# Patient Record
Sex: Male | Born: 1950 | Race: White | Hispanic: No | Smoking: Former smoker
Health system: Southern US, Community
[De-identification: ages and names within clinical notes are randomized; demographics above are authoritative.]

## PROBLEM LIST (undated history)

## (undated) DIAGNOSIS — N529 Male erectile dysfunction, unspecified: Secondary | ICD-10-CM

## (undated) DIAGNOSIS — K429 Umbilical hernia without obstruction or gangrene: Secondary | ICD-10-CM

## (undated) DIAGNOSIS — Z86010 Personal history of colonic polyps: Secondary | ICD-10-CM

## (undated) HISTORY — DX: Personal history of colonic polyps: Z86.010

## (undated) HISTORY — PX: HERNIA REPAIR: SHX51

## (undated) HISTORY — DX: Umbilical hernia without obstruction or gangrene: K42.9

## (undated) HISTORY — DX: Male erectile dysfunction, unspecified: N52.9

---

## 2000-04-21 ENCOUNTER — Other Ambulatory Visit: Admission: RE | Admit: 2000-04-21 | Discharge: 2000-04-21 | Payer: Self-pay | Admitting: Internal Medicine

## 2003-02-08 ENCOUNTER — Encounter: Admission: RE | Admit: 2003-02-08 | Discharge: 2003-02-08 | Payer: Self-pay | Admitting: General Surgery

## 2003-02-10 ENCOUNTER — Ambulatory Visit (HOSPITAL_BASED_OUTPATIENT_CLINIC_OR_DEPARTMENT_OTHER): Admission: RE | Admit: 2003-02-10 | Discharge: 2003-02-10 | Payer: Self-pay | Admitting: General Surgery

## 2003-02-10 ENCOUNTER — Ambulatory Visit (HOSPITAL_COMMUNITY): Admission: RE | Admit: 2003-02-10 | Discharge: 2003-02-10 | Payer: Self-pay | Admitting: General Surgery

## 2003-02-10 ENCOUNTER — Encounter (INDEPENDENT_AMBULATORY_CARE_PROVIDER_SITE_OTHER): Payer: Self-pay | Admitting: Specialist

## 2009-08-08 ENCOUNTER — Ambulatory Visit: Payer: Self-pay | Admitting: Internal Medicine

## 2009-08-08 DIAGNOSIS — J019 Acute sinusitis, unspecified: Secondary | ICD-10-CM

## 2009-08-08 DIAGNOSIS — Z860100 Personal history of colon polyps, unspecified: Secondary | ICD-10-CM | POA: Insufficient documentation

## 2009-08-08 HISTORY — DX: Personal history of colon polyps, unspecified: Z86.0100

## 2009-08-08 HISTORY — DX: Personal history of colonic polyps: Z86.010

## 2010-02-06 ENCOUNTER — Ambulatory Visit: Admit: 2010-02-06 | Payer: Self-pay | Admitting: Internal Medicine

## 2010-03-08 NOTE — Assessment & Plan Note (Signed)
Summary: NEW/ [facility]/ [facility]   Vital Signs:  Patient profile:   60 year old male Height:      73 inches Weight:      197.50 pounds BMI:     26.15 O2 Sat:      96 % on Room air Temp:     98.1 degrees F oral Pulse rate:   82 / minute BP sitting:   140 / 78  (left arm) Cuff size:   regular  Vitals Entered By: Zella Ball Ewing CMA Duncan Dull) (August 08, 2009 1:07 PM)  O2 Flow:  Room air  Preventive Care Screening  Colonoscopy:    Date:  04/18/2000    Next Due:  04/2010    Results:  Hyperplastic Polyp      declines tetanus today  CC: New Patient, sinus infection/RE   CC:  New Patient and sinus infection/RE.  History of Present Illness: here to re-establish after being gone for > 3 yrs;  here with 1 wk onset facial pain, pressure, fever and greenish d/c; as well as bilat ear pressure, dizziness and nause;  no ST, and Pt denies CP, sob, doe, wheezing, orthopnea, pnd, worsening LE edema, palps, dizziness or syncope   Preventive Screening-Counseling & Management  Alcohol-Tobacco     Smoking Status: quit      Drug Use:  no.    Problems Prior to Update: 1)  Colonic Polyps, Hx of  (ICD-V12.72) 2)  Sinusitis- Acute-nos  (ICD-461.9)  Medications Prior to Update: 1)  None  Current Medications (verified): 1)  Clarithromycin 500 Mg Tabs (Clarithromycin) .Marland Kitchen.. 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 mother with HTN father with lung cancer  Social History: Reviewed history and no changes  required. Married 2 children work  - Restaurant manager, fast food - Glass blower/designer Former Smoker Alcohol use-yes Drug use-no Smoking Status:  quit Drug Use:  no  Review of Systems       all otherwise negative per pt -    Physical Exam  General:  alert and overweight-appearing.  , mild ill  Head:  normocephalic and atraumatic.   Eyes:  vision grossly intact, pupils equal, and pupils round.   Ears:  bilat tm's mild red, sinus tender bilat Nose:  nasal dischargemucosal pallor and mucosal edema.   Mouth:  pharyngeal erythema and fair dentition.   Neck:  supple and no masses.   Lungs:  normal respiratory effort and normal breath sounds.   Heart:  normal rate and regular rhythm.   Extremities:  no edema, no erythema    Impression & Recommendations:  Problem # 1:  SINUSITIS- ACUTE-NOS (ICD-461.9)  His updated medication list for this problem includes:    Clarithromycin 500 Mg Tabs (Clarithromycin) .Marland Kitchen... 1po two times a day treat as above, f/u any worsening signs or symptoms   Complete Medication List: 1)  Clarithromycin 500 Mg Tabs (Clarithromycin) .Marland Kitchen.. 1po two times a day  Patient Instructions: 1)  Please take all new medications as prescribed 2)  Continue all previous medications as before this visit  3)  You can also use Mucinex OTC or it's generic for congestion  4)  Please schedule a follow-up appointment in 6 months with CPX labs Prescriptions: CLARITHROMYCIN 500 MG TABS (CLARITHROMYCIN) 1po two times a day  #20 x 0   Entered and Authorized by:   Corwin Levins MD   Signed by:   Corwin Levins MD on 08/08/2009   Method used:   Print then Give to Patient   RxID:   [id number]

## 2010-06-22 NOTE — Op Note (Signed)
NAME:  Randy Sutton, Randy Sutton                           ACCOUNT NO.:  [id number]   MEDICAL RECORD NO.:  [id number]                   PATIENT TYPE:  OUT   LOCATION:  [facility]                                 FACILITY:  [facility]   PHYSICIAN:  Ollen Gross. Vernell Morgans, M.D.              DATE OF BIRTH:  [birth year]   DATE OF PROCEDURE:  02/10/2003  DATE OF DISCHARGE:  02/10/2003                                 OPERATIVE REPORT   PREOPERATIVE DIAGNOSIS:  Right inguinal hernia.   POSTOPERATIVE DIAGNOSIS:  Right indirect inguinal hernia.   PROCEDURE:  Right inguinal hernia repair with mesh.   SURGEON:  Ollen Gross. Carolynne Edouard, M.D.   ANESTHESIA:  General endotracheal anesthesia.   PROCEDURE:  After informed consent was obtained, the patient was brought to  the operating room and placed in the supine position on the operating table.  After induction of general anesthesia, the patient's abdomen and right groin  were prepped with Betadine and draped in the usual sterile manner.  An  incision was made from the edge of the pubic tubercle on the right towards  the anterior superior iliac spine for a distance of about 5-6 cm.  This  incision was carried down through the skin and subcutaneous tissue sharply  with electrocautery.  A small bridging vein was encountered in the  subcutaneous tissue which was clamped with hemostats, divided, and ligated  with 3-0 silk ties.  The rest of the incision was carried down through the  subcutaneous tissues sharply with the electrocautery until the external  oblique fascia was encountered.  The external oblique fascia was opened  along its fibers with a 15 blade knife and then Metzenbaum scissors to the  apex of the external ring.  Beneath this, the fascia and the abdominal  sidewall of the wound were retracted laterally with a Weitlaner retractor.  Blunt dissection was carried out at the edge of the pubic tubercle until two  fingers could surround the cord structures.  A 1/2 inch Penrose  drain was  then placed around the cord structures for retraction purposes.  Gentle  skeletonization of the cord was performed by a combination of blunt  dissection with a hemostat and sharp dissection with the electrocautery.  Care was taken to avoid damage to the ilioinguinal nerve, vas deferens, and  artery to the testicle.  A hernia sac was encountered running with the cord  structures.  The hernia sac was gently teased away from the rest of the cord  structures until it was free down to its base.  The sac was then opened.  There were no visceral contents within the sac.  The sac was then ligated at  its base with a 2-0 silk suture ligature and the sac was then excised.  The  stump of the sac that had been ligated was then allowed to retract back  beneath the transversalis fascia towards the abdomen.  Next, a piece of  atrium mesh was then cut to fit, tails were cut on the mesh laterally to  wrap around the cord structures.  The mesh was then sewn inferiorly to the  shelving edge of the inguinal ligament using a running 2-0 Prolene stitch.  The mesh was then anchored superiorly with interrupted vertical mattress 2-0  Prolene stitches to the muscular aponeurotic transversalis layer.  The tails  were wrapped around the cord structures and anchored laterally with an  interrupted 2-0 Prolene stitch to the shelving edge of the inguinal  ligament.  Once this was accomplished, the mesh was in good position without  any tension.  The repair was nicely in place.  The wound was then copiously  irrigated with saline.  The external oblique fascia was then closed in a  running fashion with 2-0 Vicryl stitch.  The wound was copiously irrigated  again with saline.  The subcutaneous fascia was closed with running 3-0  Vicryl.  The wound was then infiltrated with 0.25% Marcaine with epinephrine  and the skin was closed with a running 4-0 Monocryl subcuticular  stitch.  Benzoin, Steri-Strips, and sterile  dressings were applied.  The  patient tolerated the procedure well.  At the end of the case, all needle,  sponge, and instrument counts were correct.  The patient was awakened and  taken to the recovery room in stable condition.                                               Ollen Gross. Vernell Morgans, M.D.    PST/MEDQ  D:  02/25/2003  T:  02/25/2003  Job:  [id number]

## 2011-04-09 ENCOUNTER — Encounter: Payer: Self-pay | Admitting: Internal Medicine

## 2011-04-09 DIAGNOSIS — Z0000 Encounter for general adult medical examination without abnormal findings: Secondary | ICD-10-CM | POA: Insufficient documentation

## 2011-04-10 ENCOUNTER — Ambulatory Visit (INDEPENDENT_AMBULATORY_CARE_PROVIDER_SITE_OTHER): Payer: PRIVATE HEALTH INSURANCE | Admitting: Internal Medicine

## 2011-04-10 VITALS — BP 130/72 | HR 74 | Temp 97.0°F | Ht 73.0 in | Wt 193.1 lb

## 2011-04-10 DIAGNOSIS — J019 Acute sinusitis, unspecified: Secondary | ICD-10-CM | POA: Insufficient documentation

## 2011-04-10 DIAGNOSIS — Z0000 Encounter for general adult medical examination without abnormal findings: Secondary | ICD-10-CM

## 2011-04-10 MED ORDER — LEVOFLOXACIN 250 MG PO TABS: 250.0000 mg | ORAL_TABLET | Freq: Every day | ORAL | Status: AC

## 2011-04-10 NOTE — Patient Instructions (Signed)
Take all new medications as prescribed Continue all other medications as before Please call as we discussed to have your colonscopy scheduled Please return in 6 mo with Lab testing done 3-5 days before

## 2011-04-14 ENCOUNTER — Encounter: Payer: Self-pay | Admitting: Internal Medicine

## 2011-04-14 NOTE — Assessment & Plan Note (Signed)
Mild to mod, for antibx course,  to f/u any worsening symptoms or concerns 

## 2011-04-14 NOTE — Progress Notes (Signed)
  Subjective:    Patient ID: Randy Sutton, male    DOB: 24-Aug-1950, 61 y.o.   MRN: [id number]  HPI   Here with 3 days acute onset fever, facial pain, pressure, general weakness and malaise, and greenish d/c, with slight ST, but little to no cough and Pt denies chest pain, increased sob or doe, wheezing, orthopnea, PND, increased LE swelling, palpitations, dizziness or syncope.  Has also known RIH stable without incresed swelling or pain.  Also with chronic tinnitus.  Pt denies new neurological symptoms such as new headache, or facial or extremity weakness or numbness   Pt denies polydipsia, polyuria.   Pt denies fever, wt loss, night sweats, loss of appetite, or other constitutional symptoms except for the above.  Has received letter for colonoscopy f/u, just has not called yet to schedule Past Medical History  Diagnosis Date  . COLONIC POLYPS, HX OF 08/08/2009   History reviewed. No pertinent past surgical history.  reports that he has quit smoking. He does not have any smokeless tobacco history on file. He reports that he drinks alcohol. He reports that he does not use illicit drugs. family history includes Cancer in his father and Hypertension in his mother. No Known Allergies No current outpatient prescriptions on file prior to visit.   Review of Systems Review of Systems  Constitutional: Negative for diaphoresis and unexpected weight change.  HENT: Negative for drooling and tinnitus.   Eyes: Negative for photophobia and visual disturbance.  Respiratory: Negative for choking and stridor.   Gastrointestinal: Negative for vomiting and blood in stool.  Genitourinary: Negative for hematuria and decreased urine volume.    Objective:   Physical Exam BP 130/72  Pulse 74  Temp(Src) 97 F (36.1 C) (Oral)  Ht 6\' 1"  (1.854 m)  Wt 193 lb 2 oz (87.601 kg)  BMI 25.48 kg/m2  SpO2 97% Physical Exam  VS noted Constitutional: Pt appears well-developed and well-nourished.  HENT: Head:  Normocephalic.  Right Ear: External ear normal.  Left Ear: External ear normal.  Bilat tm's mild erythema.  Sinus tender bilat.  Pharynx mild erythema Eyes: Conjunctivae and EOM are normal. Pupils are equal, round, and reactive to light.  Neck: Normal range of motion. Neck supple.  Cardiovascular: Normal rate and regular rhythm.   Pulmonary/Chest: Effort normal and breath sounds normal.  Neurological: Pt is alert. No cranial nerve deficit.  Skin: Skin is warm. No erythema.  Psychiatric: Pt behavior is normal. Thought content normal.     Assessment & Plan:

## 2011-10-09 ENCOUNTER — Ambulatory Visit (INDEPENDENT_AMBULATORY_CARE_PROVIDER_SITE_OTHER): Payer: PRIVATE HEALTH INSURANCE | Admitting: Internal Medicine

## 2011-10-09 ENCOUNTER — Encounter: Payer: Self-pay | Admitting: Internal Medicine

## 2011-10-09 ENCOUNTER — Other Ambulatory Visit (INDEPENDENT_AMBULATORY_CARE_PROVIDER_SITE_OTHER): Payer: PRIVATE HEALTH INSURANCE

## 2011-10-09 VITALS — BP 122/82 | HR 64 | Temp 97.4°F | Ht 73.0 in | Wt 195.1 lb

## 2011-10-09 DIAGNOSIS — Z0000 Encounter for general adult medical examination without abnormal findings: Secondary | ICD-10-CM

## 2011-10-09 DIAGNOSIS — Z23 Encounter for immunization: Secondary | ICD-10-CM

## 2011-10-09 DIAGNOSIS — K429 Umbilical hernia without obstruction or gangrene: Secondary | ICD-10-CM | POA: Insufficient documentation

## 2011-10-09 DIAGNOSIS — Z86010 Personal history of colonic polyps: Secondary | ICD-10-CM

## 2011-10-09 DIAGNOSIS — J309 Allergic rhinitis, unspecified: Secondary | ICD-10-CM | POA: Insufficient documentation

## 2011-10-09 DIAGNOSIS — N529 Male erectile dysfunction, unspecified: Secondary | ICD-10-CM | POA: Insufficient documentation

## 2011-10-09 LAB — HEPATIC FUNCTION PANEL
ALT: 21 U/L (ref 0–53)
Alkaline Phosphatase: 58 U/L (ref 39–117)
Bilirubin, Direct: 0.2 mg/dL (ref 0.0–0.3)
Total Bilirubin: 0.9 mg/dL (ref 0.3–1.2)
Total Protein: 6.4 g/dL (ref 6.0–8.3)

## 2011-10-09 LAB — BASIC METABOLIC PANEL
CO2: 27 mEq/L (ref 19–32)
Chloride: 104 mEq/L (ref 96–112)
Potassium: 4.4 mEq/L (ref 3.5–5.1)
Sodium: 138 mEq/L (ref 135–145)

## 2011-10-09 LAB — LIPID PANEL
LDL Cholesterol: 110 mg/dL — ABNORMAL HIGH (ref 0–99)
Total CHOL/HDL Ratio: 4

## 2011-10-09 LAB — PSA: PSA: 0.24 ng/mL (ref 0.10–4.00)

## 2011-10-09 LAB — URINALYSIS, ROUTINE W REFLEX MICROSCOPIC
Bilirubin Urine: NEGATIVE
Hgb urine dipstick: NEGATIVE
Total Protein, Urine: NEGATIVE
Urine Glucose: NEGATIVE
Urobilinogen, UA: 0.2 (ref 0.0–1.0)

## 2011-10-09 LAB — CBC WITH DIFFERENTIAL/PLATELET
Basophils Absolute: 0.1 10*3/uL (ref 0.0–0.1)
Basophils Relative: 0.8 % (ref 0.0–3.0)
Eosinophils Absolute: 0.3 10*3/uL (ref 0.0–0.7)
Hemoglobin: 14.9 g/dL (ref 13.0–17.0)
Lymphocytes Relative: 31.6 % (ref 12.0–46.0)
MCHC: 34.1 g/dL (ref 30.0–36.0)
MCV: 89.2 fl (ref 78.0–100.0)
Monocytes Absolute: 0.6 10*3/uL (ref 0.1–1.0)
Neutro Abs: 3.4 10*3/uL (ref 1.4–7.7)
Neutrophils Relative %: 54.5 % (ref 43.0–77.0)
RBC: 4.9 Mil/uL (ref 4.22–5.81)
RDW: 12.9 % (ref 11.5–14.6)

## 2011-10-09 MED ORDER — ASPIRIN 81 MG PO TBEC: 81.0000 mg | DELAYED_RELEASE_TABLET | Freq: Every day | ORAL | Status: AC

## 2011-10-09 MED ORDER — FEXOFENADINE HCL 180 MG PO TABS: 180.0000 mg | ORAL_TABLET | Freq: Every day | ORAL | Status: DC

## 2011-10-09 MED ORDER — SILDENAFIL CITRATE 100 MG PO TABS: 50.0000 mg | ORAL_TABLET | Freq: Every day | ORAL | Status: DC | PRN

## 2011-10-09 NOTE — Patient Instructions (Addendum)
Take all new medications as prescribed - the allegra Continue all other medications as before- the viagra Please have the pharmacy call with any refills you may need. Please start the Aspirin 81 mg - 1 per day - Enteric Coated only Please call for your colonoscopy follow up as per the letter you received. You had the tetanus shot today Please go to LAB in the Basement for the blood and/or urine tests to be done today You will be contacted by phone if any changes need to be made immediately.  Otherwise, you will receive a letter about your results with an explanation. Please return in 1 year for your yearly visit, or sooner if needed, with Lab testing done 3-5 days before

## 2011-10-09 NOTE — Assessment & Plan Note (Signed)
For allegra prn 

## 2011-10-09 NOTE — Assessment & Plan Note (Signed)
Overall doing well, age appropriate education and counseling updated, referrals for preventative services and immunizations addressed, dietary and smoking counseling addressed, most recent labs and ECG reviewed.  I have personally reviewed and have noted: 1) the patient's medical and social history 2) The pt's use of alcohol, tobacco, and illicit drugs 3) The patient's current medications and supplements 4) Functional ability including ADL's, fall risk, home safety risk, hearing and visual impairment 5) Diet and physical activities 6) Evidence for depression or mood disorder 7) The patient's height, weight, and BMI have been recorded in the chart I have made referrals, and provided counseling and education based on review of the above For tetanus, and labs

## 2011-10-09 NOTE — Assessment & Plan Note (Signed)
Pt to call for colonscopy

## 2011-10-09 NOTE — Progress Notes (Signed)
Subjective:    Patient ID: Randy Sutton, male    DOB: Jun 14, 1950, 61 y.o.   MRN: [id number]  HPI  Here for wellness and f/u;  Overall doing ok;  Pt denies CP, worsening SOB, DOE, wheezing, orthopnea, PND, worsening LE edema, palpitations, dizziness or syncope.  Pt denies neurological change such as new Headache, facial or extremity weakness.  Pt denies polydipsia, polyuria, or low sugar symptoms. Pt states overall good compliance with treatment and medications, good tolerability, and trying to follow lower cholesterol diet.  Pt denies worsening depressive symptoms, suicidal ideation or panic. No fever, wt loss, night sweats, loss of appetite, or other constitutional symptoms.  Pt states good ability with ADL's, low fall risk, home safety reviewed and adequate, no significant changes in hearing or vision, and occasionally active with exercise.  Does have several wks ongoing nasal allergy symptoms with clear congestion, itch and sneeze, without fever, pain, ST, cough or wheezing. But tonsils have been swollen as well however , seems to gag him off and on;  claritin may have helped at least the sinus in the past, has taken several his wife has had.   Pt denies fever, wt loss, night sweats, loss of appetite.  Trying to stay away from chronic med. Only takes Asa prn. Past Medical History  Diagnosis Date  . COLONIC POLYPS, HX OF 08/08/2009   No past surgical history on file.  reports that he has quit smoking. He does not have any smokeless tobacco history on file. He reports that he drinks alcohol. He reports that he does not use illicit drugs. family history includes Cancer in his father and Hypertension in his mother. No Known Allergies No current outpatient prescriptions on file prior to visit.   Review of Systems Review of Systems  Constitutional: Negative for diaphoresis, activity change, appetite change and unexpected weight change.  HENT: Negative for hearing loss, ear pain, facial swelling, mouth  sores and neck stiffness.   Eyes: Negative for pain, redness and visual disturbance.  Respiratory: Negative for shortness of breath and wheezing.   Cardiovascular: Negative for chest pain and palpitations.  Gastrointestinal: Negative for diarrhea, blood in stool, abdominal distention and rectal pain.  Genitourinary: Negative for hematuria, flank pain and decreased urine volume.  Musculoskeletal: Negative for myalgias and joint swelling.  Skin: Negative for color change and wound.  Neurological: Negative for syncope and numbness.  Hematological: Negative for adenopathy.  Psychiatric/Behavioral: Negative for hallucinations, self-injury, decreased concentration and agitation.     Objective:   Physical Exam BP 122/82  Pulse 64  Temp 97.4 F (36.3 C) (Oral)  Ht 6\' 1"  (1.854 m)  Wt 195 lb 2 oz (88.508 kg)  BMI 25.74 kg/m2  SpO2 96% Physical Exam  VS noted Constitutional: Pt is oriented to person, place, and time. Appears well-developed and well-nourished.  HENT:  Head: Normocephalic and atraumatic.  Right Ear: External ear normal.  Left Ear: External ear normal.  Nose: Nose normal.  Mouth/Throat: Oropharynx is clear and moist. pharynx with mild cobblestoning only,  No tonsilar enlargement Eyes: Conjunctivae and EOM are normal. Pupils are equal, round, and reactive to light.  Neck: Normal range of motion. Neck supple. No JVD present. No tracheal deviation present.  Cardiovascular: Normal rate, regular rhythm, normal heart sounds and intact distal pulses.   Pulmonary/Chest: Effort normal and breath sounds normal.  Abdominal: Soft. Bowel sounds are normal. There is no tenderness.  Musculoskeletal: Normal range of motion. Exhibits no edema.  Lymphadenopathy:  Has no  cervical adenopathy.  Neurological: Pt is alert and oriented to person, place, and time. Pt has normal reflexes. No cranial nerve deficit.  Skin: Skin is warm and dry. No rash noted.  Psychiatric:  Has  normal mood and  affect. Behavior is normal.     Assessment & Plan:

## 2011-10-09 NOTE — Assessment & Plan Note (Signed)
For viagra prn 

## 2011-10-31 ENCOUNTER — Encounter: Payer: Self-pay | Admitting: Internal Medicine

## 2012-10-12 ENCOUNTER — Encounter: Payer: PRIVATE HEALTH INSURANCE | Admitting: Internal Medicine

## 2012-10-29 ENCOUNTER — Encounter: Payer: Self-pay | Admitting: Internal Medicine

## 2012-10-29 ENCOUNTER — Ambulatory Visit (INDEPENDENT_AMBULATORY_CARE_PROVIDER_SITE_OTHER): Payer: PRIVATE HEALTH INSURANCE | Admitting: Internal Medicine

## 2012-10-29 VITALS — BP 140/90 | HR 91 | Temp 97.9°F | Ht 73.0 in | Wt 198.5 lb

## 2012-10-29 DIAGNOSIS — J309 Allergic rhinitis, unspecified: Secondary | ICD-10-CM

## 2012-10-29 DIAGNOSIS — N529 Male erectile dysfunction, unspecified: Secondary | ICD-10-CM

## 2012-10-29 DIAGNOSIS — J019 Acute sinusitis, unspecified: Secondary | ICD-10-CM

## 2012-10-29 MED ORDER — MECLIZINE HCL 12.5 MG PO TABS: 12.5000 mg | ORAL_TABLET | Freq: Three times a day (TID) | ORAL | Status: DC | PRN

## 2012-10-29 MED ORDER — LEVOFLOXACIN 250 MG PO TABS: 250.0000 mg | ORAL_TABLET | Freq: Every day | ORAL | Status: DC

## 2012-10-29 NOTE — Patient Instructions (Signed)
Please take all new medication as prescribed  Please continue all other medications as before, and refills have been done if requested.  Please have the pharmacy call with any other refills you may need.  You can also take Delsym OTC for cough, and/or Mucinex (or it's generic off brand) for congestion, and tylenol as needed for pain.   

## 2012-10-29 NOTE — Progress Notes (Signed)
  Subjective:    Patient ID: Randy Sutton, male    DOB: 1950-02-20, 62 y.o.   MRN: [id number]  HPI  Here with 2-3 days acute onset fever, facial pain, pressure, headache, general weakness and malaise, and greenish d/c, with mild ST and cough, but pt denies chest pain, wheezing, increased sob or doe, orthopnea, PND, increased LE swelling, palpitations, dizziness or syncope.  Does have several wks ongoing nasal allergy symptoms with clearish congestion, itch and sneezing, without fever, pain, ST, cough, swelling or wheezing.  ED symptoms have improved, declines viagra or testosterone check today Past Medical History  Diagnosis Date  . COLONIC POLYPS, HX OF 08/08/2009  . Umbilical hernia   . Erectile dysfunction    Past Surgical History  Procedure Laterality Date  . Hernia repair      RIH    reports that he has quit smoking. He does not have any smokeless tobacco history on file. He reports that  drinks alcohol. He reports that he does not use illicit drugs. family history includes Cancer in his father; Hypertension in his mother. No Known Allergies Current Outpatient Prescriptions on File Prior to Visit  Medication Sig Dispense Refill  . fexofenadine (ALLEGRA) 180 MG tablet Take 1 tablet (180 mg total) by mouth daily.  90 tablet  3  . sildenafil (VIAGRA) 100 MG tablet Take 0.5-1 tablets (50-100 mg total) by mouth daily as needed for erectile dysfunction.  10 tablet  11   No current facility-administered medications on file prior to visit.   Review of Systems  Constitutional: Negative for unexpected weight change, or unusual diaphoresis  HENT: Negative for tinnitus.   Eyes: Negative for photophobia and visual disturbance.  Respiratory: Negative for choking and stridor.   Gastrointestinal: Negative for vomiting and blood in stool.  Genitourinary: Negative for hematuria and decreased urine volume.  Musculoskeletal: Negative for acute joint swelling Skin: Negative for color change and  wound.  Neurological: Negative for tremors and numbness other than noted  Psychiatric/Behavioral: Negative for decreased concentration or  hyperactivity.       Objective:   Physical Exam BP 140/90  Pulse 91  Temp(Src) 97.9 F (36.6 C) (Oral)  Ht 6\' 1"  (1.854 m)  Wt 198 lb 8 oz (90.039 kg)  BMI 26.19 kg/m2  SpO2 97% VS noted,  Constitutional: Pt appears well-developed and well-nourished.  HENT: Head: NCAT.  Right Ear: External ear normal.  Left Ear: External ear normal.  Bilat tm's with mild erythema.  Max sinus areas mild tender.  Pharynx with mild erythema, no exudate Eyes: Conjunctivae and EOM are normal. Pupils are equal, round, and reactive to light.  Neck: Normal range of motion. Neck supple.  Cardiovascular: Normal rate and regular rhythm.   Pulmonary/Chest: Effort normal and breath sounds normal.  Neurological: Pt is alert. Not confused  Skin: Skin is warm. No erythema.  Psychiatric: Pt behavior is normal. Thought content normal.     Assessment & Plan:

## 2012-11-01 NOTE — Assessment & Plan Note (Signed)
Mild to mod, for antibx course,  to f/u any worsening symptoms or concerns 

## 2012-11-01 NOTE — Assessment & Plan Note (Signed)
Improved symptomatically, to call if needs further med

## 2012-11-01 NOTE — Assessment & Plan Note (Signed)
OK for otc allegra prn,  to f/u any worsening symptoms or concerns

## 2013-05-12 ENCOUNTER — Ambulatory Visit (INDEPENDENT_AMBULATORY_CARE_PROVIDER_SITE_OTHER): Payer: PRIVATE HEALTH INSURANCE | Admitting: Family Medicine

## 2013-05-12 ENCOUNTER — Encounter: Payer: Self-pay | Admitting: Family Medicine

## 2013-05-12 VITALS — BP 120/80 | Temp 98.5°F | Wt 199.0 lb

## 2013-05-12 DIAGNOSIS — L255 Unspecified contact dermatitis due to plants, except food: Secondary | ICD-10-CM

## 2013-05-12 DIAGNOSIS — L237 Allergic contact dermatitis due to plants, except food: Secondary | ICD-10-CM | POA: Insufficient documentation

## 2013-05-12 MED ORDER — PREDNISONE 20 MG PO TABS: ORAL_TABLET | ORAL | Status: DC

## 2013-05-12 NOTE — Progress Notes (Signed)
Pre visit review using our clinic review tool, if applicable. No additional management support is needed unless otherwise documented below in the visit note. 

## 2013-05-12 NOTE — Patient Instructions (Signed)
Take the prednisone as directed  Return when necessary 

## 2013-05-12 NOTE — Progress Notes (Signed)
   Subjective:    Patient ID: Randy Sutton, male    DOB: 18-Feb-1950, 63 y.o.   MRN: [id number]  HPI Randy Sutton is a 63 year old married male nonsmoker patient of Dr. Jonny RuizJohn at the room office who was sent here for treatment of a contact dermatitis.  He called and wanted to be seen at his doctor's office and they told him they were too busy and sent him over here??????????????  He did some yard work over the weekend he's developed a contact dermatitis involving his arms abdomen and a spot on his right temple.   Review of Systems    review of systems negative Objective:   Physical Exam  Well-developed and nourished male no acute distress vital signs stable he is afebrile examination skin shows contact dermatitis involving the both upper arms dime size lesion on his right for head and in right abdominal area        Assessment & Plan:  Contact dermatitis..........Marland Kitchen

## 2016-06-12 ENCOUNTER — Encounter: Payer: Self-pay | Admitting: Sports Medicine

## 2016-06-12 ENCOUNTER — Ambulatory Visit: Payer: Self-pay

## 2016-06-12 ENCOUNTER — Ambulatory Visit (INDEPENDENT_AMBULATORY_CARE_PROVIDER_SITE_OTHER): Payer: 59 | Admitting: Sports Medicine

## 2016-06-12 ENCOUNTER — Ambulatory Visit (INDEPENDENT_AMBULATORY_CARE_PROVIDER_SITE_OTHER): Payer: 59

## 2016-06-12 VITALS — BP 160/100 | HR 74 | Ht 73.0 in | Wt 197.0 lb

## 2016-06-12 DIAGNOSIS — G8929 Other chronic pain: Secondary | ICD-10-CM | POA: Diagnosis not present

## 2016-06-12 DIAGNOSIS — M25511 Pain in right shoulder: Secondary | ICD-10-CM

## 2016-06-12 NOTE — Progress Notes (Addendum)
OFFICE VISIT NOTE Randy FellsMichael D. Delorise [name], Randy Sutton  [facility] [place] [phone]  Randy Sutton - 66 y.o. male MRN [id number]  Date of birth: October 29, 1950  Visit Date: 06/12/2016  PCP: Corwin LevinsJohn, James W, MD   Referred by: Corwin LevinsJohn, James W, MD  Autumn McNeil,cma acting as scribe for Dr. Berline Choughigby.  SUBJECTIVE:   Chief Complaint  Patient presents with  . NP: RT Shoulder Pain   HPI: As below and per problem based documentation when appropriate.  Randy ShelterGordon reports a chronic HX of Posterior RT shoulder pain for several years. No prior imaging or injections. He was moving furniture at work approx 1 month ago when he heard a popping with pain. Lifting and raising arm over head are just a few triggers. He taking Motrin 400mg  every 4 hours with some relief. At rest there is a constant dull pain in RT shoulder blade.    Review of Systems  Constitutional: Negative.   HENT: Negative.   Gastrointestinal: Negative.   Genitourinary: Negative.   Musculoskeletal: Positive for joint pain.  Skin: Negative.   Neurological: Negative.   Endo/Heme/Allergies: Negative.   Psychiatric/Behavioral: Negative.     Otherwise per HPI.  HISTORY & PERTINENT PRIOR DATA:  No specialty comments available. He reports that he has quit smoking. He has never used smokeless tobacco. No results for input(s): HGBA1C, LABURIC in the last 8760 hours. Medications & Allergies reviewed per EMR Patient Active Problem List   Diagnosis Date Noted  . Chronic right shoulder pain 06/12/2016  . Poison ivy dermatitis 05/12/2013  . Acute sinus infection 10/29/2012  . Allergic rhinitis, cause unspecified 10/09/2011  . Umbilical hernia   . Erectile dysfunction   . Preventative health care 04/09/2011  . COLONIC POLYPS, HX OF 08/08/2009   Past Medical History:  Diagnosis Date  . COLONIC POLYPS, HX OF 08/08/2009  . Erectile dysfunction   . Umbilical hernia    Family History  Problem Relation Age of Onset   . Hypertension Mother   . Cancer Father    Past Surgical History:  Procedure Laterality Date  . HERNIA REPAIR     RIH   Social History   Occupational History  . Not on file.   Social History Main Topics  . Smoking status: Former Games developermoker  . Smokeless tobacco: Never Used  . Alcohol use Yes     Comment: sicial  . Drug use: No  . Sexual activity: Not on file    OBJECTIVE:  VS:  HT:6\' 1"  (185.4 cm)   WT:197 lb (89.4 kg)  BMI:26    BP:(!) 170/100  HR:74bpm  TEMP: ( )  RESP:98 % EXAM: Findings:  WDWN, NAD, Non-toxic appearing Alert & appropriately interactive Not depressed or anxious appearing No increased work of breathing. Pupils are equal. EOM intact without nystagmus No clubbing or cyanosis of the extremities appreciated No significant rashes/lesions/ulcerations overlying the examined area. Radial pulses 2+/4.  No significant generalized UE edema.  Sensation intact to light touch in upper extremities  Right shoulder: Pain with axial load and circumduction with positive clicking.  Localizing to the glenohumeral joint.  Pain with internal rotation and external rotation resistance.  Strength is 3 out of 5 with empty can testing, internal rotation and external rotation.  Full overhead range of motion.  Over read on the x-ray does have read on the x-ray does reveal some posterior inferior glenoid wear/osteoarthritis      Dg Shoulder Right  Result Date: 06/12/2016 CLINICAL  DATA:  Chronic right shoulder pain EXAM: RIGHT SHOULDER - 2+ VIEW COMPARISON:  None. FINDINGS: Mild degenerative change of the acromioclavicular joint is noted. No acute fracture or dislocation is noted. No soft tissue abnormality is seen. IMPRESSION: No acute abnormality noted. Electronically Signed   By: Alcide Clever M.D.   On: 06/12/2016 10:43   ASSESSMENT & PLAN:  Visit Diagnoses:  1. Chronic right shoulder pain    Problem List Items Addressed This Visit    Chronic right shoulder pain - Primary      Intra-articular injection performed today Scapular stabilization and rotator cuff strengthening exercises provided with avoidance of any type of overhead activity. If any lack of improvement consider further diagnostic imaging with MRI of the shoulder given concern for intra-articular process.  +++++++++++++++++++++++++++++++++++++++++++++++++++++++++++++++++++++++++++++ PROCEDURE NOTE -  ULTRASOUND GUIDEDINJECTION: Right intra-articular shoulder injection Images were obtained and interpreted by myself, Gaspar Bidding, Randy Sutton  Images have been saved and stored to PACS system. Images obtained on: GE S7 Ultrasound machine  ULTRASOUND FINDINGS: Fraying of the posterior glenoid labrum otherwise no significant abnormality appreciated in limited views  DESCRIPTION OF PROCEDURE:  The patient's clinical condition is marked by substantial pain and/or significant functional disability. Other conservative therapy has not provided relief, is contraindicated, or not appropriate. There is a reasonable likelihood that injection will significantly improve the patient's pain and/or functional impairment. After discussing the risks, benefits and expected outcomes of the injection and all questions were reviewed and answered, the patient wished to undergo the above named procedure. Verbal consent was obtained. The ultrasound was used to identify the target structure and adjacent neurovascular structures. The skin was then prepped in sterile fashion and the target structure was injected under direct visualization using sterile technique as below: PREP: Alcohol, Ethel Chloride APPROACH: Posterior, single injection, 21g 2" needle INJECTATE: 2 cc 0.5% marcaine, 2cc 40mg  DepoMedrol ASPIRATE: N/A DRESSING: Band-Aid  Post procedural instructions including recommending icing and warning signs for infection were reviewed. This procedure was well tolerated and there were no complications.   IMPRESSION: Succesful US  Guided Injection           Relevant Medications   aspirin EC 81 MG tablet   Other Relevant Orders   DG Shoulder Right (Completed)   US GUIDED NEEDLE PLACEMENT(NO LINKED CHARGES)      Follow-up: Return in about 6 weeks (around 07/24/2016).   CMA/ATC served as Neurosurgeon during this visit. History, Physical, and Plan performed by medical provider. Documentation and orders reviewed and attested to.      Gaspar Bidding, Randy Sutton    [facility] Physician    06/12/2016 10:55 AM

## 2016-06-12 NOTE — Assessment & Plan Note (Signed)
Intra-articular injection performed today Scapular stabilization and rotator cuff strengthening exercises provided with avoidance of any type of overhead activity. If any lack of improvement consider further diagnostic imaging with MRI of the shoulder given concern for intra-articular process.  +++++++++++++++++++++++++++++++++++++++++++++++++++++++++++++++++++++++++++++ PROCEDURE NOTE -  ULTRASOUND GUIDEDINJECTION: Right intra-articular shoulder injection Images were obtained and interpreted by myself, Gaspar BiddingMichael Rigby, DO  Images have been saved and stored to PACS system. Images obtained on: GE S7 Ultrasound machine  ULTRASOUND FINDINGS: Fraying of the posterior glenoid labrum otherwise no significant abnormality appreciated in limited views  DESCRIPTION OF PROCEDURE:  The patient's clinical condition is marked by substantial pain and/or significant functional disability. Other conservative therapy has not provided relief, is contraindicated, or not appropriate. There is a reasonable likelihood that injection will significantly improve the patient's pain and/or functional impairment. After discussing the risks, benefits and expected outcomes of the injection and all questions were reviewed and answered, the patient wished to undergo the above named procedure. Verbal consent was obtained. The ultrasound was used to identify the target structure and adjacent neurovascular structures. The skin was then prepped in sterile fashion and the target structure was injected under direct visualization using sterile technique as below: PREP: Alcohol, Ethel Chloride APPROACH: Posterior, single injection, 21g 2" needle INJECTATE: 2 cc 0.5% marcaine, 2cc 40mg  DepoMedrol ASPIRATE: N/A DRESSING: Band-Aid  Post procedural instructions including recommending icing and warning signs for infection were reviewed. This procedure was well tolerated and there were no complications.   IMPRESSION: Succesful US  Guided Injection

## 2016-06-12 NOTE — Patient Instructions (Addendum)
Please perform the exercise program that Randy Sutton has prepared for you and gone over in detail on a daily basis.  In addition to the handout you were provided you can access your program through: [email or web address]   Your unique program code is: [email or web address]

## 2016-07-03 ENCOUNTER — Telehealth: Payer: Self-pay | Admitting: Sports Medicine

## 2016-07-03 NOTE — Telephone Encounter (Signed)
Pt was seen 06/12/16 for chronic right shoulder pain. He was given steroid injection and home exercises.

## 2016-07-03 NOTE — Telephone Encounter (Signed)
Patient calling asking for something for his pain levels. States the extra strength tylenol is no longer helping.

## 2016-07-04 ENCOUNTER — Other Ambulatory Visit: Payer: Self-pay

## 2016-07-04 DIAGNOSIS — M25511 Pain in right shoulder: Principal | ICD-10-CM

## 2016-07-04 DIAGNOSIS — G8929 Other chronic pain: Secondary | ICD-10-CM

## 2016-07-04 MED ORDER — TRAMADOL HCL 50 MG PO TABS: 50.0000 mg | ORAL_TABLET | Freq: Three times a day (TID) | ORAL | 0 refills | Status: DC | PRN

## 2016-07-04 NOTE — Telephone Encounter (Signed)
Per Dr. Berline Choughigby can try Tramadol 50 mg #60/0 refills. Needs MRI arthrogram.

## 2016-07-04 NOTE — Telephone Encounter (Signed)
Pt calling again to check status of message. Will resend to Dr. Berline Choughigby.

## 2016-07-04 NOTE — Telephone Encounter (Signed)
Pt aware. Order placed and rx called in to [facility] pharmacy.

## 2016-07-04 NOTE — Telephone Encounter (Signed)
Patient called again. I spoke with CMA who stated she would put it as a high priority.

## 2016-07-24 ENCOUNTER — Ambulatory Visit: Payer: PRIVATE HEALTH INSURANCE | Admitting: Sports Medicine

## 2017-04-07 ENCOUNTER — Ambulatory Visit: Payer: 59 | Admitting: Internal Medicine

## 2017-04-07 ENCOUNTER — Encounter: Payer: Self-pay | Admitting: Internal Medicine

## 2017-04-07 DIAGNOSIS — J0110 Acute frontal sinusitis, unspecified: Secondary | ICD-10-CM | POA: Diagnosis not present

## 2017-04-07 MED ORDER — AMOXICILLIN-POT CLAVULANATE 875-125 MG PO TABS: 1.0000 | ORAL_TABLET | Freq: Two times a day (BID) | ORAL | 0 refills | Status: DC

## 2017-04-07 NOTE — Progress Notes (Signed)
   Subjective:    Patient ID: Randy Sutton, Randy Sutton    DOB: May 20, 1950, 67 y.o.   MRN: [id number]  HPI The patient is a 67 YO man coming in for sinus problems. Going on for about 3 weeks now. He is taking vicks night and day time. Started taking mucinex about 2-3 days ago. Denies fevers but having chills. Denies cough or SOB. Some sinus pressure and headaches. Overall is not getting better and worsening. Some change in activity due to discomfort.   Review of Systems  Constitutional: Positive for activity change, appetite change and chills. Negative for fatigue, fever and unexpected weight change.  HENT: Positive for congestion, postnasal drip, rhinorrhea, sinus pressure and sinus pain. Negative for ear discharge, ear pain, sneezing, sore throat, tinnitus, trouble swallowing and voice change.   Eyes: Negative.   Respiratory: Negative for cough, chest tightness, shortness of breath and wheezing.   Cardiovascular: Negative.   Gastrointestinal: Negative.   Neurological: Positive for headaches.      Objective:   Physical Exam  Constitutional: He is oriented to person, place, and time. He appears well-developed and well-nourished.  HENT:  Head: Normocephalic and atraumatic.  Oropharynx with redness and clear drainage, nose with swollen turbinates, TMs normal bilaterally, frontal sinus pain on exam  Eyes: EOM are normal.  Neck: Normal range of motion. No thyromegaly present.  Cardiovascular: Normal rate and regular rhythm.  Pulmonary/Chest: Effort normal and breath sounds normal. No respiratory distress. He has no wheezes. He has no rales.  Abdominal: Soft.  Lymphadenopathy:    He has no cervical adenopathy.  Neurological: He is alert and oriented to person, place, and time.  Skin: Skin is warm and dry.   Vitals:   04/07/17 1519  BP: (!) 142/100  Pulse: 83  Temp: 98.2 F (36.8 C)  TempSrc: Oral  SpO2: 96%  Weight: 191 lb (86.6 kg)  Height: 6\' 1"  (1.854 m)      Assessment & Plan:

## 2017-04-07 NOTE — Patient Instructions (Signed)
We have sent in augmentin to take 1 pill twice a day for 10 days.   Think about taking zyrtec to help with drainage.

## 2017-04-08 NOTE — Assessment & Plan Note (Signed)
Rx for augmentin and advised to start zyrtec daily for congestion. Can use otc cold medications safe for BP as elevated today.

## 2017-06-13 ENCOUNTER — Encounter: Payer: Self-pay | Admitting: Family Medicine

## 2017-06-13 ENCOUNTER — Ambulatory Visit (INDEPENDENT_AMBULATORY_CARE_PROVIDER_SITE_OTHER): Payer: 59 | Admitting: Family Medicine

## 2017-06-13 VITALS — BP 148/90 | HR 71 | Temp 98.3°F | Wt 196.2 lb

## 2017-06-13 DIAGNOSIS — J019 Acute sinusitis, unspecified: Secondary | ICD-10-CM

## 2017-06-13 MED ORDER — AMOXICILLIN-POT CLAVULANATE 875-125 MG PO TABS: 1.0000 | ORAL_TABLET | Freq: Two times a day (BID) | ORAL | 0 refills | Status: DC

## 2017-06-13 NOTE — Patient Instructions (Signed)

## 2017-06-13 NOTE — Progress Notes (Signed)
  Subjective:     Patient ID: Randy Sutton, male   DOB: 1950/07/07, 67 y.o.   MRN: [id number]  HPI Patient seen with approximately 3 week history of sinus pressure frontal and maxillary region along with some thick yellow nasal discharge. Occasional headaches. He had similar infection early March which cleared fully following treatment with Augmentin. He's also had some occasional cough. No fevers or chills. Intermittent right ear pain. He's had some fleeting vertigo symptoms and occasional slight nausea. He's taken over-the-counter medications and saline nasal irrigation without relief.  Past Medical History:  Diagnosis Date  . COLONIC POLYPS, HX OF 08/08/2009  . Erectile dysfunction   . Umbilical hernia    Past Surgical History:  Procedure Laterality Date  . HERNIA REPAIR     RIH    reports that he has quit smoking. He has never used smokeless tobacco. He reports that he drinks alcohol. He reports that he does not use drugs. family history includes Cancer in his father; Hypertension in his mother. No Known Allergies   Review of Systems  Constitutional: Positive for fatigue. Negative for chills and fever.  HENT: Positive for congestion, sinus pressure and sinus pain. Negative for sore throat.   Respiratory: Positive for cough. Negative for shortness of breath.        Objective:   Physical Exam  Constitutional: He appears well-developed and well-nourished.  HENT:  Right Ear: External ear normal.  Left Ear: External ear normal.  Mouth/Throat: Oropharynx is clear and moist.  Thick crusted discharge both nares  Eyes: Conjunctivae are normal. Right eye exhibits no discharge. Left eye exhibits no discharge.  Neck: Neck supple.  Cardiovascular: Normal rate and regular rhythm.  Pulmonary/Chest: Effort normal and breath sounds normal. He has no wheezes. He has no rales.  Lymphadenopathy:    He has no cervical adenopathy.       Assessment:     Acute sinusitis frontal and  maxillary sinuses bilaterally    Plan:     -Start Augmentin 875 mg twice daily with food for 10 days -Continue saline nasal irrigation -Follow-up with primary if symptoms persist or worsen  Kristian Covey MD [facility] at [facility]

## 2018-12-31 IMAGING — DX DG SHOULDER 2+V*R*
3 series · 3 of 3 positions shown · non-contrast
Comparison: None.

CLINICAL DATA: Chronic right shoulder pain

EXAM:
RIGHT SHOULDER - 2+ VIEW

[shoulder internal rotation ap]
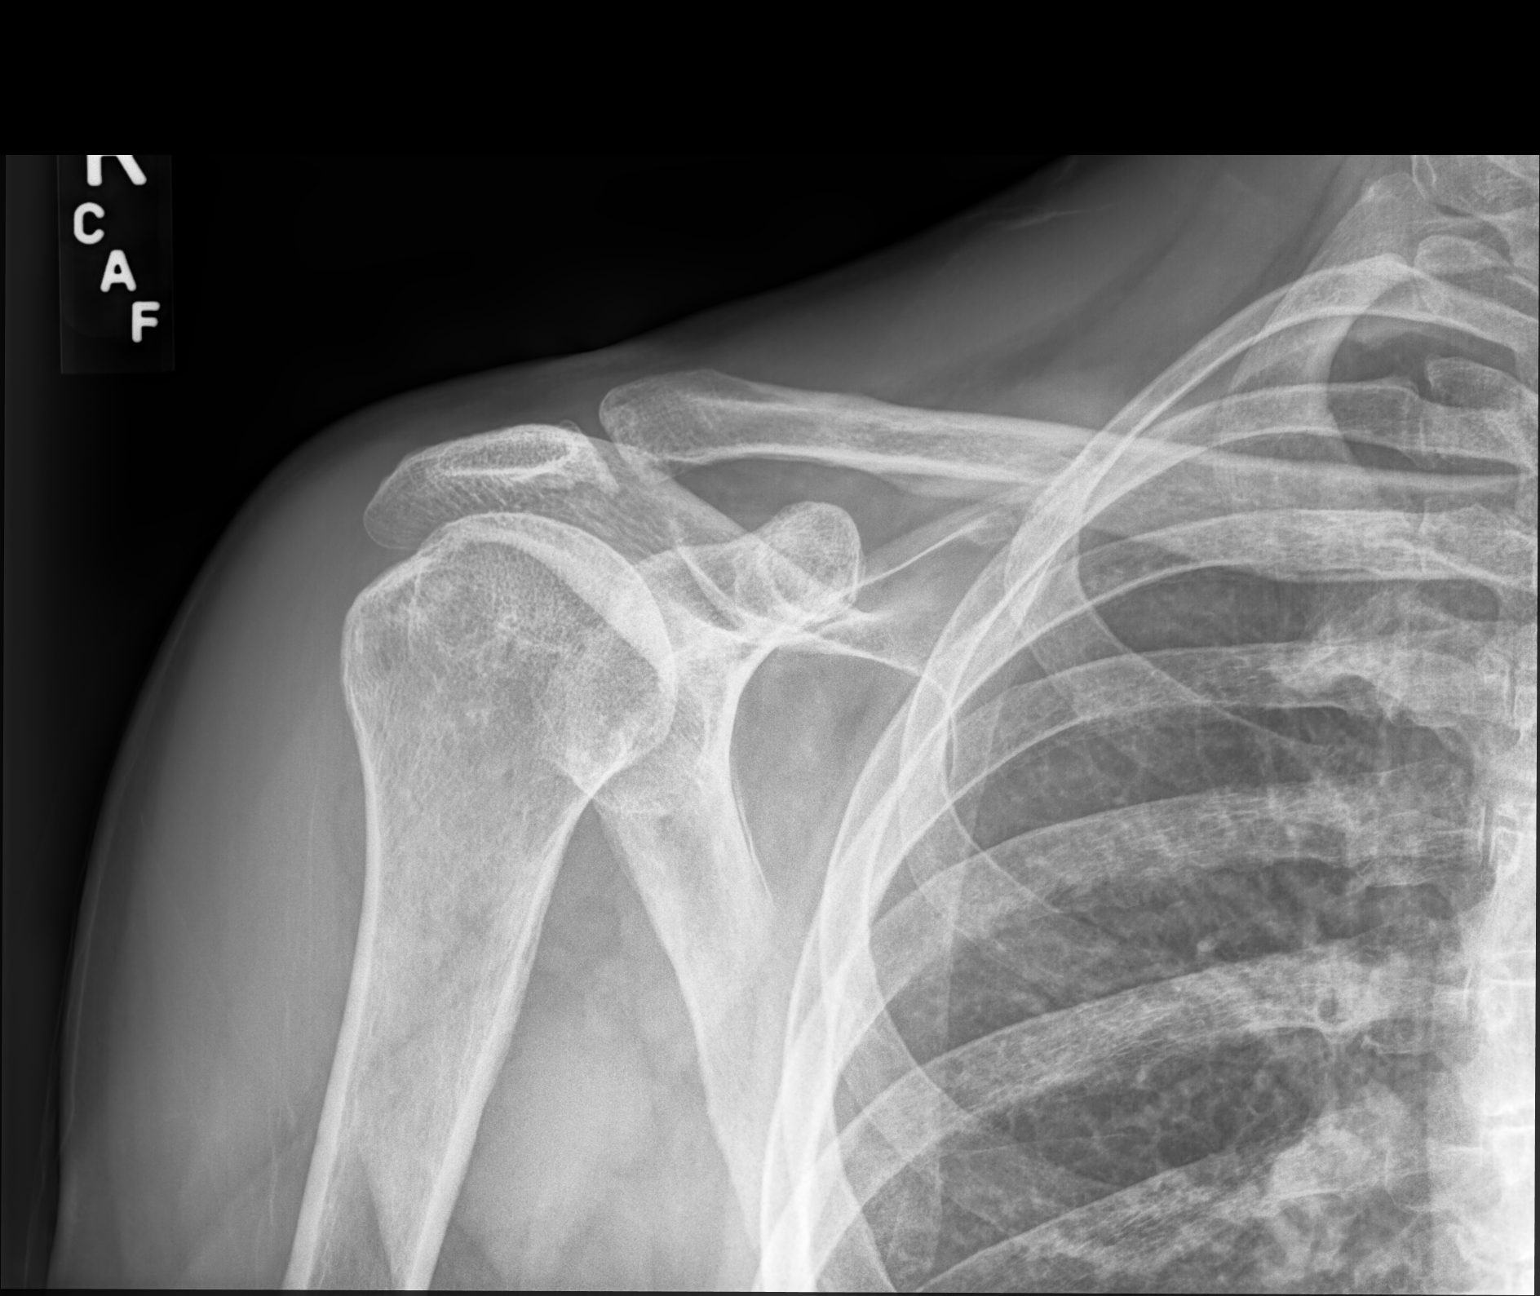

[shoulder external rotation ap]
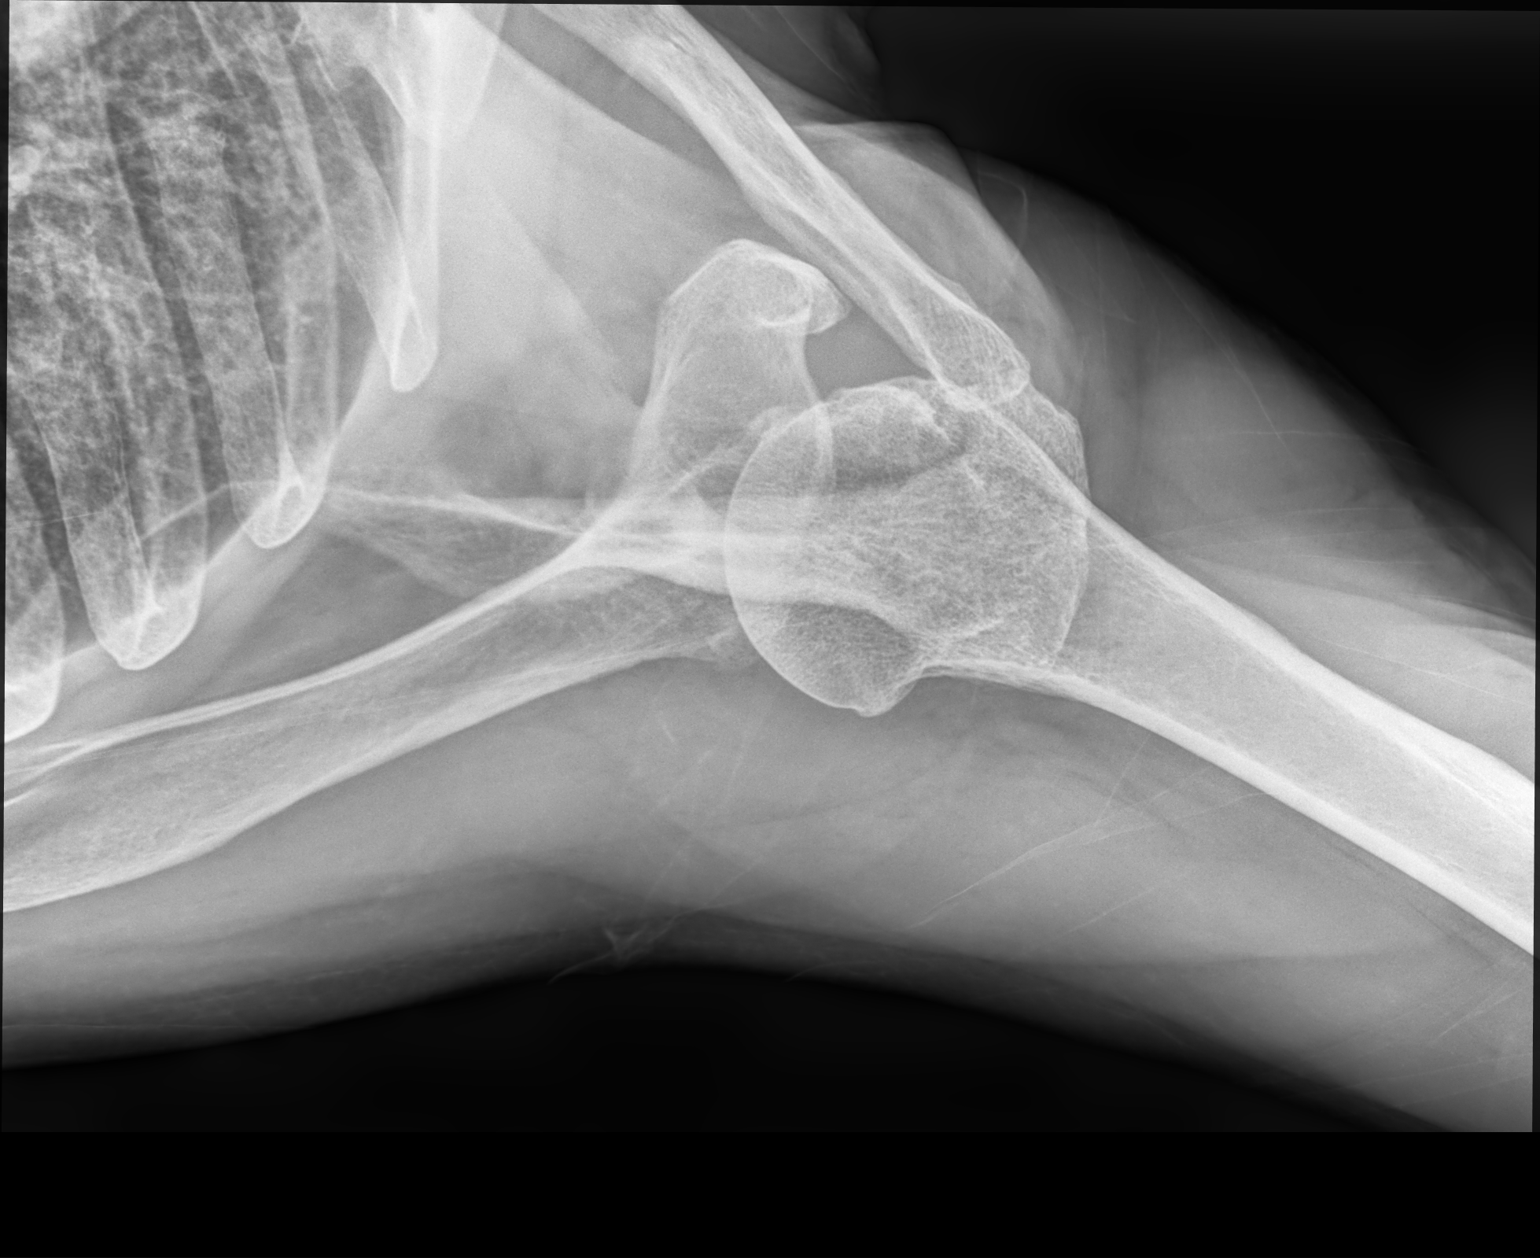

[shoulder y view]
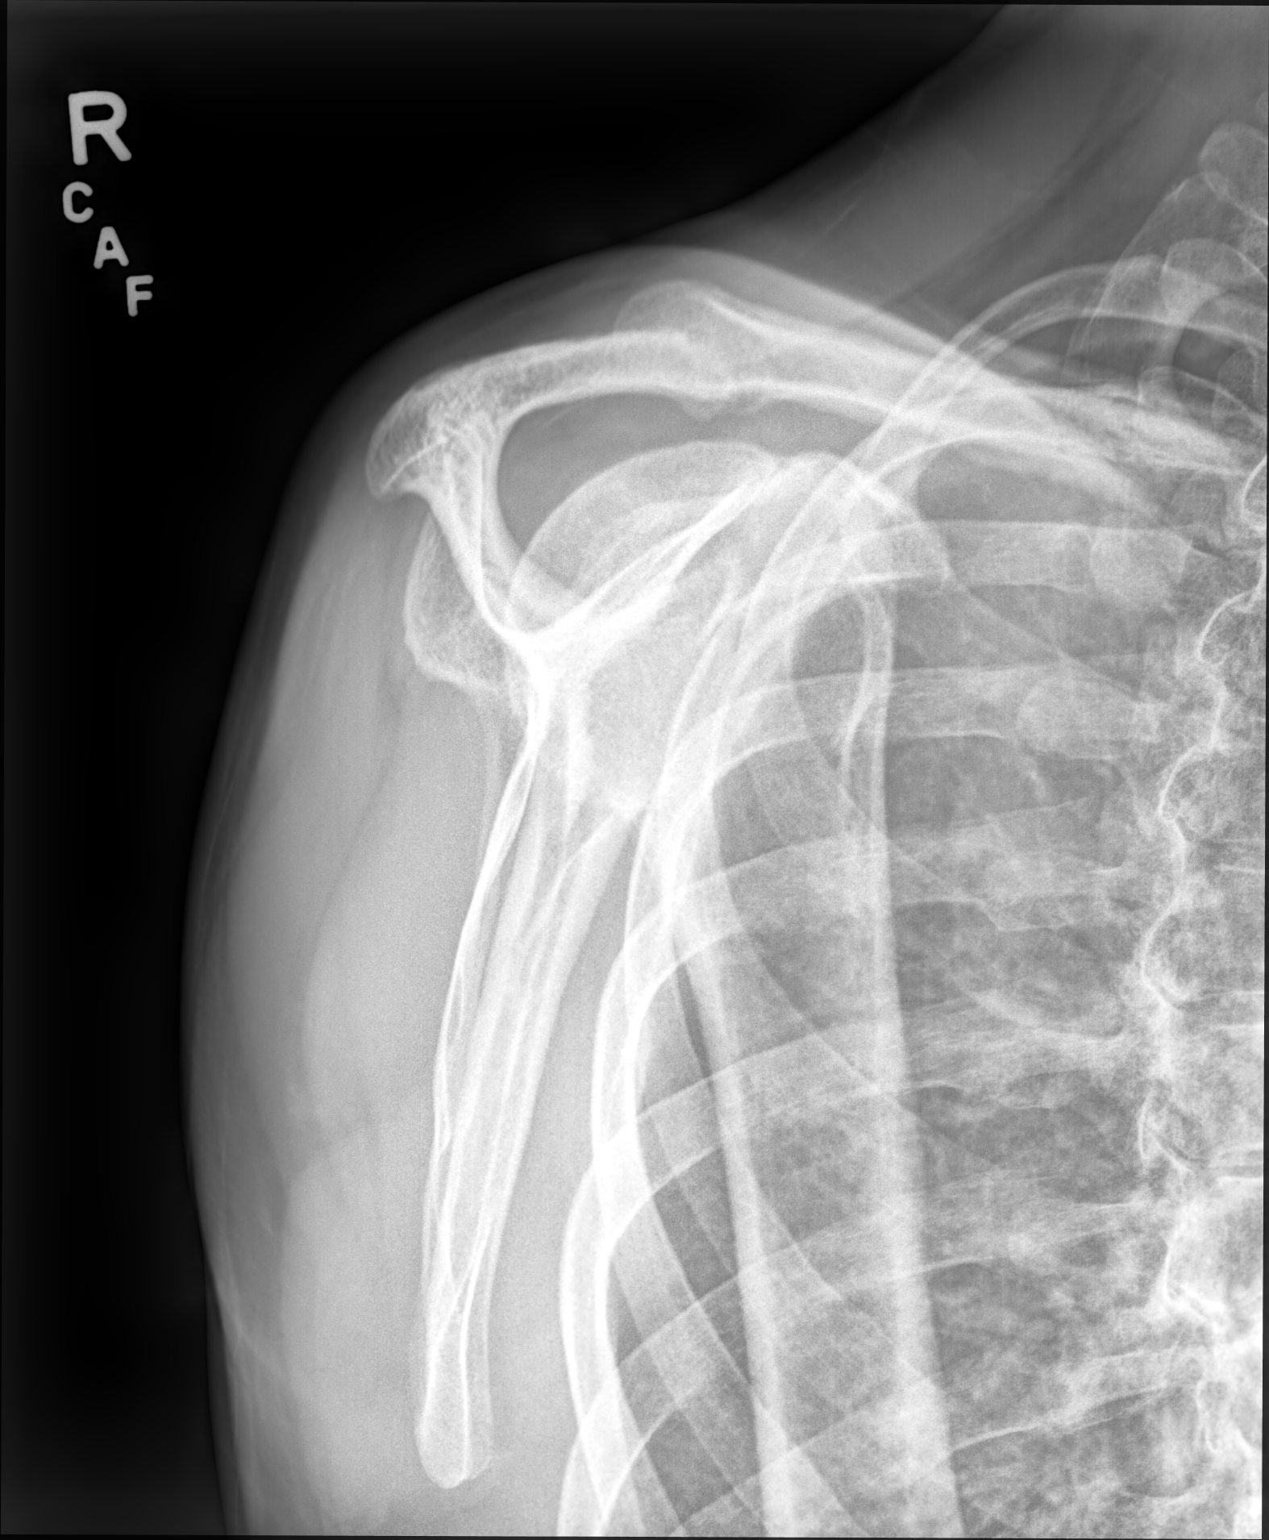

[3 of 3 positions shown; findings below may reference images not displayed]

FINDINGS: Mild degenerative change of the acromioclavicular joint is noted. No
acute fracture or dislocation is noted. No soft tissue abnormality
is seen.
IMPRESSION: No acute abnormality noted.

## 2019-03-05 ENCOUNTER — Ambulatory Visit: Payer: 59

## 2019-03-11 ENCOUNTER — Ambulatory Visit: Payer: 59 | Attending: Internal Medicine

## 2019-03-11 DIAGNOSIS — Z20822 Contact with and (suspected) exposure to covid-19: Secondary | ICD-10-CM

## 2019-03-12 LAB — NOVEL CORONAVIRUS, NAA: SARS-CoV-2, NAA: DETECTED — AB

## 2019-04-21 ENCOUNTER — Ambulatory Visit: Payer: 59 | Attending: Internal Medicine

## 2019-04-21 DIAGNOSIS — Z23 Encounter for immunization: Secondary | ICD-10-CM

## 2019-04-21 NOTE — Progress Notes (Signed)
   Covid-19 Vaccination Clinic  Name:  LAIRD RUNNION    MRN: [id number] DOB: 11-10-1950  04/21/2019  Mr. Mathison was observed post Covid-19 immunization for 15 minutes without incident. He was provided with Vaccine Information Sheet and instruction to access the V-Safe system.   Mr. Guzzi was instructed to call 911 with any severe reactions post vaccine: Marland Kitchen Difficulty breathing  . Swelling of face and throat  . A fast heartbeat  . A bad rash all over body  . Dizziness and weakness   Immunizations Administered    Name Date Dose VIS Date Route   Pfizer COVID-19 Vaccine 04/21/2019  8:59 AM 0.3 mL 01/15/2019 Intramuscular   Manufacturer: [employer], [employer]   Lot: [id number]   NDC: [address]-1

## 2019-05-12 ENCOUNTER — Ambulatory Visit: Payer: 59 | Attending: Internal Medicine

## 2019-05-12 DIAGNOSIS — Z23 Encounter for immunization: Secondary | ICD-10-CM

## 2019-05-12 NOTE — Progress Notes (Signed)
   Covid-19 Vaccination Clinic  Name:  Randy Sutton    MRN: [id number] DOB: 1950-09-10  05/12/2019  Mr. [name] was observed post Covid-19 immunization for 15 minutes without incident. He was provided with Vaccine Information Sheet and instruction to access the V-Safe system.   Mr. [name] was instructed to call 911 with any severe reactions post vaccine: Marland Kitchen Difficulty breathing  . Swelling of face and throat  . A fast heartbeat  . A bad rash all over body  . Dizziness and weakness   Immunizations Administered    Name Date Dose VIS Date Route   Pfizer COVID-19 Vaccine 05/12/2019  8:09 AM 0.3 mL 01/15/2019 Intramuscular   Manufacturer: [employer], [employer]   Lot: [id number]   NDC: [address]-9

## 2023-12-09 ENCOUNTER — Emergency Department (HOSPITAL_BASED_OUTPATIENT_CLINIC_OR_DEPARTMENT_OTHER): Payer: Self-pay

## 2023-12-09 ENCOUNTER — Other Ambulatory Visit: Payer: Self-pay

## 2023-12-09 ENCOUNTER — Observation Stay (HOSPITAL_COMMUNITY): Payer: Self-pay

## 2023-12-09 ENCOUNTER — Observation Stay (HOSPITAL_BASED_OUTPATIENT_CLINIC_OR_DEPARTMENT_OTHER)
Admission: EM | Admit: 2023-12-09 | Discharge: 2023-12-10 | Disposition: A | Discharge status: Home / Self Care | Payer: Self-pay | Attending: Emergency Medicine | Admitting: Emergency Medicine

## 2023-12-09 ENCOUNTER — Encounter (HOSPITAL_BASED_OUTPATIENT_CLINIC_OR_DEPARTMENT_OTHER): Payer: Self-pay

## 2023-12-09 DIAGNOSIS — E785 Hyperlipidemia, unspecified: Secondary | ICD-10-CM | POA: Insufficient documentation

## 2023-12-09 DIAGNOSIS — R29700 NIHSS score 0: Secondary | ICD-10-CM | POA: Diagnosis not present

## 2023-12-09 DIAGNOSIS — E876 Hypokalemia: Secondary | ICD-10-CM | POA: Insufficient documentation

## 2023-12-09 DIAGNOSIS — I10 Essential (primary) hypertension: Secondary | ICD-10-CM | POA: Diagnosis not present

## 2023-12-09 DIAGNOSIS — I6389 Other cerebral infarction: Principal | ICD-10-CM | POA: Insufficient documentation

## 2023-12-09 DIAGNOSIS — I6622 Occlusion and stenosis of left posterior cerebral artery: Secondary | ICD-10-CM | POA: Insufficient documentation

## 2023-12-09 DIAGNOSIS — R202 Paresthesia of skin: Secondary | ICD-10-CM | POA: Diagnosis present

## 2023-12-09 DIAGNOSIS — G459 Transient cerebral ischemic attack, unspecified: Principal | ICD-10-CM

## 2023-12-09 DIAGNOSIS — Z79899 Other long term (current) drug therapy: Secondary | ICD-10-CM | POA: Diagnosis not present

## 2023-12-09 DIAGNOSIS — F10920 Alcohol use, unspecified with intoxication, uncomplicated: Secondary | ICD-10-CM | POA: Insufficient documentation

## 2023-12-09 DIAGNOSIS — I6612 Occlusion and stenosis of left anterior cerebral artery: Secondary | ICD-10-CM | POA: Diagnosis not present

## 2023-12-09 DIAGNOSIS — Z7982 Long term (current) use of aspirin: Secondary | ICD-10-CM | POA: Insufficient documentation

## 2023-12-09 DIAGNOSIS — I639 Cerebral infarction, unspecified: Secondary | ICD-10-CM

## 2023-12-09 LAB — PROTIME-INR
INR: 0.9 (ref 0.8–1.2)
Prothrombin Time: 12.7 s (ref 11.4–15.2)

## 2023-12-09 LAB — CBC
HCT: 49.7 % (ref 39.0–52.0)
Hemoglobin: 17.2 g/dL — ABNORMAL HIGH (ref 13.0–17.0)
MCH: 30.1 pg (ref 26.0–34.0)
MCHC: 34.6 g/dL (ref 30.0–36.0)
MCV: 87 fL (ref 80.0–100.0)
Platelets: 220 K/uL (ref 150–400)
RBC: 5.71 MIL/uL (ref 4.22–5.81)
RDW: 12.7 % (ref 11.5–15.5)
WBC: 11.1 K/uL — ABNORMAL HIGH (ref 4.0–10.5)
nRBC: 0 % (ref 0.0–0.2)

## 2023-12-09 LAB — COMPREHENSIVE METABOLIC PANEL WITH GFR
ALT: 33 U/L (ref 0–44)
AST: 26 U/L (ref 15–41)
Albumin: 5 g/dL (ref 3.5–5.0)
Alkaline Phosphatase: 87 U/L (ref 38–126)
Anion gap: 12 (ref 5–15)
BUN: 16 mg/dL (ref 8–23)
CO2: 26 mmol/L (ref 22–32)
Calcium: 10.1 mg/dL (ref 8.9–10.3)
Chloride: 98 mmol/L (ref 98–111)
Creatinine, Ser: 1.03 mg/dL (ref 0.61–1.24)
GFR, Estimated: >60 mL/min (ref >60)
Glucose, Bld: 103 mg/dL — ABNORMAL HIGH (ref 70–99)
Potassium: 3.7 mmol/L (ref 3.5–5.1)
Sodium: 137 mmol/L (ref 135–145)
Total Bilirubin: 0.7 mg/dL (ref 0.0–1.2)
Total Protein: 8 g/dL (ref 6.5–8.1)

## 2023-12-09 LAB — APTT: aPTT: 28 s (ref 24–36)

## 2023-12-09 LAB — DIFFERENTIAL
Abs Immature Granulocytes: 0.03 K/uL (ref 0.00–0.07)
Basophils Absolute: 0.1 K/uL (ref 0.0–0.1)
Basophils Relative: 0 %
Eosinophils Absolute: 0.1 K/uL (ref 0.0–0.5)
Eosinophils Relative: 1 %
Immature Granulocytes: 0 %
Lymphocytes Relative: 16 %
Lymphs Abs: 1.8 K/uL (ref 0.7–4.0)
Monocytes Absolute: 0.8 K/uL (ref 0.1–1.0)
Monocytes Relative: 7 %
Neutro Abs: 8.4 K/uL — ABNORMAL HIGH (ref 1.7–7.7)
Neutrophils Relative %: 76 %

## 2023-12-09 LAB — HEMOGLOBIN A1C
Hgb A1c MFr Bld: 5 % (ref 4.8–5.6)
Mean Plasma Glucose: 96.8 mg/dL

## 2023-12-09 LAB — CBG MONITORING, ED: Glucose-Capillary: 91 mg/dL (ref 70–99)

## 2023-12-09 MED ORDER — LABETALOL HCL 5 MG/ML IV SOLN
10.0000 mg | Freq: Once | INTRAVENOUS | Status: AC
  Administered 2023-12-09: 10 mg via INTRAVENOUS
  Filled 2023-12-09: qty 4

## 2023-12-09 MED ORDER — ACETAMINOPHEN 500 MG PO TABS: 500.0000 mg | ORAL_TABLET | Freq: Four times a day (QID) | ORAL | Status: DC | PRN

## 2023-12-09 MED ORDER — LACTATED RINGERS IV SOLN: INTRAVENOUS | Status: DC

## 2023-12-09 MED ORDER — PROCHLORPERAZINE EDISYLATE 10 MG/2ML IJ SOLN: 5.0000 mg | Freq: Four times a day (QID) | INTRAMUSCULAR | Status: DC | PRN

## 2023-12-09 MED ORDER — CLOPIDOGREL BISULFATE 75 MG PO TABS
75.0000 mg | ORAL_TABLET | Freq: Every day | ORAL | Status: DC
  Administered 2023-12-10: 75 mg via ORAL
  Filled 2023-12-09: qty 1

## 2023-12-09 MED ORDER — MELATONIN 5 MG PO TABS: 5.0000 mg | ORAL_TABLET | Freq: Every evening | ORAL | Status: DC | PRN

## 2023-12-09 MED ORDER — POLYETHYLENE GLYCOL 3350 17 G PO PACK: 17.0000 g | PACK | Freq: Every day | ORAL | Status: DC | PRN

## 2023-12-09 MED ORDER — STROKE: EARLY STAGES OF RECOVERY BOOK
Freq: Once | Status: AC
  Filled 2023-12-09: qty 1

## 2023-12-09 MED ORDER — ENOXAPARIN SODIUM 40 MG/0.4ML IJ SOSY
40.0000 mg | PREFILLED_SYRINGE | INTRAMUSCULAR | Status: DC
  Administered 2023-12-10: 40 mg via SUBCUTANEOUS
  Filled 2023-12-09: qty 0.4

## 2023-12-09 MED ORDER — ASPIRIN 81 MG PO TBEC
81.0000 mg | DELAYED_RELEASE_TABLET | Freq: Every day | ORAL | Status: DC
  Administered 2023-12-10: 81 mg via ORAL
  Filled 2023-12-09: qty 1

## 2023-12-09 NOTE — Progress Notes (Signed)
 Transport here to pick pt up for CT

## 2023-12-09 NOTE — H&P (Incomplete)
 History and Physical  Randy Sutton FMW:[id number] DOB: September 26, 1950 DOA: 12/09/2023  Referring physician: Accepted by Dr. [facility], Hospitalist service.  PCP: Pcp, No  Outpatient Specialists: None. Patient coming from: Home through [facility].  Chief Complaint: Right-sided numbness, weakness, slurred speech.  HPI: Randy Sutton is a 73 y.o. male with no significant past medical history who presents to [facility] ED with complaints of right-sided numbness and tingling, associated with slurred speech.  States this happened once in the past in July 2025, the exact same symptoms, lasting roughly an hour.  States around 5:15 PM on 12/08/2023 he went to the store and he had these symptoms of right sided numbness and tingling.  Also had slurred speech.  The symptoms lasted about an hour.  He returned home and went to sleep and he was awakened by a terrible headache around 2:30 AM.  He decided to present to the ER for further evaluation.  No personal or family history of stroke.  Was not taking a daily aspirin .  States he gets more than 8 hours of sleep at night and his work is not stressful.  Denies use of tobacco, quit many years ago.  Drinks beer occasionally.  In the ER, the right-sided numbness and slurred speech had resolved.  Noncontrast head CT nonacute.  Seen by neurology/stroke team.  Recommended admission for TIA workup.  Admitted by [facility], hospitalist service.  ED Course: Temperature 98.4.  BP 173/96, pulse 65, respiration rate 16, O2 saturation 97% on room air.  Review of Systems: Review of systems as noted in the HPI. All other systems reviewed and are negative.   Past Medical History:  Diagnosis Date   COLONIC POLYPS, HX OF 08/08/2009   Erectile dysfunction    Umbilical hernia    Past Surgical History:  Procedure Laterality Date   HERNIA REPAIR     RIH    Social History:  reports that he has quit smoking. He has never used smokeless tobacco. He reports current alcohol use. He  reports that he does not use drugs.   No Known Allergies  Family History  Problem Relation Age of Onset   Hypertension Mother    Cancer Father       Prior to Admission medications   Medication Sig Start Date End Date Taking? Authorizing Provider  cetirizine (ZYRTEC) 10 MG tablet Take 10 mg by mouth daily.   Yes [provider]  Multiple Vitamin (MULTIVITAMIN) tablet Take 1 tablet by mouth daily.   Yes [provider]  amoxicillin -clavulanate (AUGMENTIN ) 875-125 MG tablet Take 1 tablet by mouth 2 (two) times daily. Patient not taking: Reported on 12/09/2023 06/13/17   Micheal Wolm ORN, MD  traMADol  (ULTRAM ) 50 MG tablet Take 1 tablet (50 mg total) by mouth every 8 (eight) hours as needed. Patient not taking: Reported on 12/09/2023 07/04/16   Marquette Ozell BIRCH, DO    Physical Exam: BP (!) 187/120 (BP Location: Right Arm)   Pulse 70   Temp 97.6 F (36.4 C) (Oral)   Resp 20   Ht 6' 1 (1.854 m)   Wt 88.5 kg   SpO2 98%   BMI 25.73 kg/m   General: 73 y.o. year-old male well developed well nourished in no acute distress.  Alert and oriented x3. Cardiovascular: Regular rate and rhythm with no rubs or gallops.  No thyromegaly or JVD noted.  No lower extremity edema. 2/4 pulses in all 4 extremities. Respiratory: Clear to auscultation with no wheezes or rales. Good  inspiratory effort. Abdomen: Soft nontender nondistended with normal bowel sounds x4 quadrants. Muskuloskeletal: No cyanosis, clubbing or edema noted bilaterally Neuro: CN II-XII intact, strength, sensation, reflexes Skin: No ulcerative lesions noted or rashes Psychiatry: Judgement and insight appear normal. Mood is appropriate for condition and setting          Labs on Admission:  Basic Metabolic Panel: Recent Labs  Lab 12/09/23 1454  NA 137  K 3.7  CL 98  CO2 26  GLUCOSE 103*  BUN 16  CREATININE 1.03  CALCIUM 10.1   Liver Function Tests: Recent Labs  Lab 12/09/23 1454  AST 26  ALT 33   ALKPHOS 87  BILITOT 0.7  PROT 8.0  ALBUMIN 5.0   No results for input(s): LIPASE, AMYLASE in the last 168 hours. No results for input(s): AMMONIA in the last 168 hours. CBC: Recent Labs  Lab 12/09/23 1454  WBC 11.1*  NEUTROABS 8.4*  HGB 17.2*  HCT 49.7  MCV 87.0  PLT 220   Cardiac Enzymes: No results for input(s): CKTOTAL, CKMB, CKMBINDEX, TROPONINI in the last 168 hours.  BNP (last 3 results) No results for input(s): BNP in the last 8760 hours.  ProBNP (last 3 results) No results for input(s): PROBNP in the last 8760 hours.  CBG: Recent Labs  Lab 12/09/23 1506  GLUCAP 91    Radiological Exams on Admission: CT HEAD WO CONTRAST Result Date: 12/09/2023 EXAM: CT HEAD WITHOUT CONTRAST 12/09/2023 03:20:49 PM TECHNIQUE: CT of the head was performed without the administration of intravenous contrast. Automated exposure control, iterative reconstruction, and/or weight based adjustment of the mA/kV was utilized to reduce the radiation dose to as low as reasonably achievable. COMPARISON: None available. CLINICAL HISTORY: Neuro deficit, acute, stroke suspected. FINDINGS: BRAIN AND VENTRICLES: There is no evidence of an acute infarct, intracranial hemorrhage, mass, midline shift, hydrocephalus, or extra-axial fluid collection. There is mild cerebral atrophy. 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 Vertebrobasilar dolicoectasia. ORBITS: No acute abnormality. SINUSES: No acute abnormality. SOFT TISSUES AND SKULL: No acute soft tissue abnormality. No skull fracture. IMPRESSION: 1. No acute intracranial abnormality. 2. Extensive chronic small vessel ischemic disease. Electronically signed by: Dasie Hamburg MD 12/09/2023 03:53 PM EST RP Workstation: [id number]    EKG: I  independently viewed the EKG done and my findings are as followed: Sinus tachycardia rate of 107.  Nonspecific ST-T changes.  QTc 472.  Assessment/Plan Present on Admission:  TIA (transient ischemic attack)  Principal Problem:   TIA (transient ischemic attack)  Acute CVA MRI brain revealed small acute or subacute infarct in the left posterior frontal white matter. CTA head and neck revealed no large LVO, severe left A3 ACA stenosis, and severe distal left P2 PCA stenosis. Stroke workup in process Frequent neurochecks Follow transthoracic echocardiogram Continue telemetry monitoring PT/OT/speech therapy evaluation Fasting lipid panel, hemoglobin A1c DAPT x 21 days, then aspirin  alone High intensity statin Further management per neurology  Hypertension, BP is not at goal, elevated Permissive hypertension Treat SBP greater than 220 or DBP greater than 120 IV labetalol with parameters. 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   DVT prophylaxis: Subcutaneous daily.  Code Status: Full code.  Family Communication: None at bedside.  Disposition Plan: Admitted to telemetry unit.  Consults called: Neurology/stroke team.  Admission status: Observation status.   Status is: Observation    Terry LOISE Hurst MD [facility] Pager [phone]  If 7PM-7AM, please contact night-coverage [email or web address] Password [email or web address]  12/09/2023, 10:45 PM

## 2023-12-09 NOTE — Plan of Care (Signed)
    Randy Sutton, is a 73 y.o. male, DOB - 01-18-1951, FMW:[id number]  Relatively healthy gentleman with few medical problems presented to [facility] with right-sided tingling numbness and weakness along with some aphasia and dizziness which happened yesterday, symptoms resolved he presented to [facility] where head CT did not show any stroke, he does have very high blood pressure in the ER, case was discussed with neurologist Dr. Matthews by the ER physician who recommended a workup for TIA.   Vitals:   12/09/23 1530 12/09/23 1545 12/09/23 1600 12/09/23 1630  BP: (!) 191/123 (!) 188/129 (!) 190/147 (!) 198/125  Pulse: 98 97 (!) 105 99  Resp: (!) 21 20 18 18   Temp:      SpO2: 96% 99% 96% 96%  Weight:      Height:            Data Review   Micro Results No results found for this or any previous visit (from the past 240 hours).  Radiology Reports CT HEAD WO CONTRAST Result Date: 12/09/2023 EXAM: CT HEAD WITHOUT CONTRAST 12/09/2023 03:20:49 PM TECHNIQUE: CT of the head was performed without the administration of intravenous contrast. Automated exposure control, iterative reconstruction, and/or weight based adjustment of the mA/kV was utilized to reduce the radiation dose to as low as reasonably achievable. COMPARISON: None available. CLINICAL HISTORY: Neuro deficit, acute, stroke suspected. FINDINGS: BRAIN AND VENTRICLES: There is no evidence of an acute infarct, intracranial hemorrhage, mass, midline shift, hydrocephalus, or extra-axial fluid collection. There is mild cerebral atrophy. 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 Vertebrobasilar dolicoectasia. ORBITS: No acute abnormality. SINUSES: No acute abnormality.  SOFT TISSUES AND SKULL: No acute soft tissue abnormality. No skull fracture. IMPRESSION: 1. No acute intracranial abnormality. 2. Extensive chronic small vessel ischemic disease. Electronically signed by: Dasie Hamburg MD 12/09/2023 03:53 PM EST RP Workstation: [id number]    CBC Recent Labs  Lab 12/09/23 1454  WBC 11.1*  HGB 17.2*  HCT 49.7  PLT 220  MCV 87.0  MCH 30.1  MCHC 34.6  RDW 12.7  LYMPHSABS 1.8  MONOABS 0.8  EOSABS 0.1  BASOSABS 0.1    Chemistries  Recent Labs  Lab 12/09/23 1454  NA 137  K 3.7  CL 98  CO2 26  GLUCOSE 103*  BUN 16  CREATININE 1.03  CALCIUM 10.1  AST 26  ALT 33  ALKPHOS 87  BILITOT 0.7   ------------------------------------------------------------------------------------------------------------------ estimated creatinine clearance is 74.3 mL/min (by C-G formula based on SCr of 1.03 mg/dL). ------------------------------------------------------------------------------------------------------------------ No results for input(s): HGBA1C in the last 72 hours. ------------------------------------------------------------------------------------------------------------------ No results for input(s): CHOL, HDL, LDLCALC, TRIG, CHOLHDL, LDLDIRECT in the last 72 hours. ------------------------------------------------------------------------------------------------------------------ No results for input(s): TSH, T4TOTAL, T3FREE, THYROIDAB in the last 72 hours.  Invalid input(s): FREET3 ------------------------------------------------------------------------------------------------------------------ No results for input(s): VITAMINB12, FOLATE, FERRITIN, TIBC, IRON, RETICCTPCT in the last 72 hours.  Coagulation profile Recent Labs  Lab 12/09/23 1454  INR 0.9    No results for input(s): DDIMER in the last 72 hours.  Cardiac Enzymes No results for input(s): CKMB, TROPONINI, MYOGLOBIN in the last 168  hours.  Invalid input(s): CK ------------------------------------------------------------------------------------------------------------------ Invalid input(s): POCBNP   Signature  Lavada Stank M.D on 12/09/2023 at 5:16 PM   -  To page go to [email or web address]

## 2023-12-09 NOTE — Consult Note (Incomplete)
 NEUROLOGY CONSULT NOTE   Date of service: December 09, 2023 Patient Name: Randy Sutton MRN:  [id number] DOB:  08-Dec-1950 Chief Complaint: Episode of R sided numbness and weakness along with speech difficulty concerning for TIA Requesting Provider: Dennise Lavada POUR, MD  History of Present Illness  Randy Sutton is a 73 y.o. male with no significant hx who presents with R sided numbness weakness and slurred speech along with feeling dizzy.  Drove to grocery store and got out of the car, felt dizzy. In the store, noted he had trouble picking tomatoes with his R hand, felt numb in he R forearm. Drove back home from the store, speech slurred and difficult. The entire episode lasted about an hour and then resolved spontaneously.  No prior hx of stroke, no family hx of stroke. Quit smoking 15 years ago. Does not use any recreational substances. Denies hx of DM2, HTN, HLD but also has not seen a PCP in 3+ year and is not established with one right now.  Came to the ED after headache that started around 2am.  LKW: 1730 Modified rankin score: 0-Completely asymptomatic and back to baseline post- stroke IV Thrombolysis: not offered, no symptoms at this time. EVT: not offered, low suspicion for LVO  NIHSS components Score: Comment  1a Level of Conscious 0[]  1[]  2[]  3[]      1b LOC Questions 0[]  1[]  2[]       1c LOC Commands 0[]  1[]  2[]       2 Best Gaze 0[]  1[]  2[]       3 Visual 0[]  1[]  2[]  3[]      4 Facial Palsy 0[]  1[]  2[]  3[]      5a Motor Arm - left 0[]  1[]  2[]  3[]  4[]  UN[]    5b Motor Arm - Right 0[]  1[]  2[]  3[]  4[]  UN[]    6a Motor Leg - Left 0[]  1[]  2[]  3[]  4[]  UN[]    6b Motor Leg - Right 0[]  1[]  2[]  3[]  4[]  UN[]    7 Limb Ataxia 0[]  1[]  2[]  UN[]      8 Sensory 0[]  1[]  2[]  UN[]      9 Best Language 0[]  1[]  2[]  3[]      10 Dysarthria 0[]  1[]  2[]  UN[]      11 Extinct. and Inattention 0[]  1[]  2[]       TOTAL: 0      ROS  Comprehensive ROS performed and pertinent positives documented in HPI    Past History   Past Medical History:  Diagnosis Date   COLONIC POLYPS, HX OF 08/08/2009   Erectile dysfunction    Umbilical hernia     Past Surgical History:  Procedure Laterality Date   HERNIA REPAIR     RIH    Family History: Family History  Problem Relation Age of Onset   Hypertension Mother    Cancer Father     Social History  reports that he has quit smoking. He has never used smokeless tobacco. He reports current alcohol use. He reports that he does not use drugs.  No Known Allergies  Medications  No current facility-administered medications for this encounter.  Vitals   Vitals:   12/09/23 1858 12/09/23 1900 12/09/23 1930 12/09/23 2202  BP: (!) 190/122 (!) 184/115 (!) 182/125 (!) 187/120  Pulse: 77 80 90 70  Resp: 19 17 (!) 21 20  Temp: 98.3 F (36.8 C)   97.6 F (36.4 C)  TempSrc: Oral   Oral  SpO2: 96% 93% 97% 98%  Weight:      Height:  Body mass index is 25.73 kg/m.   Physical Exam   General: Laying comfortably in bed; in no acute distress.  HENT: Normal oropharynx and mucosa. Normal external appearance of ears and nose.  Neck: Supple, no pain or tenderness  CV: No JVD. No peripheral edema.  Pulmonary: Symmetric Chest rise. Normal respiratory effort.  Abdomen: Soft to touch, non-tender.  Ext: No cyanosis, edema, or deformity  Skin: No rash. Normal palpation of skin.   Musculoskeletal: Normal digits and nails by inspection. No clubbing.   Neurologic Examination  Mental status/Cognition: Alert, oriented to self, place, month and year, good attention.  Speech/language: Fluent, comprehension intact, object naming intact, repetition intact.  Cranial nerves:   CN II Pupils equal and reactive to light, no VF deficits    CN III,IV,VI EOM intact, no gaze preference or deviation, no nystagmus    CN V normal sensation in V1, V2, and V3 segments bilaterally    CN VII no asymmetry, no nasolabial fold flattening    CN VIII normal hearing to  speech    CN IX & X normal palatal elevation, no uvular deviation    CN XI 5/5 head turn and 5/5 shoulder shrug bilaterally    CN XII midline tongue protrusion    Motor:  Muscle bulk: normal, tone normal, pronator drift none tremor none Mvmt Root Nerve  Muscle Right Left Comments  SA C5/6 Ax Deltoid 5 5   EF C5/6 Mc Biceps 5 5   EE C6/7/8 Rad Triceps 5 5   WF C6/7 Med FCR     WE C7/8 PIN ECU     F Ab C8/T1 U ADM/FDI 5 5   HF L1/2/3 Fem Illopsoas 5 5   KE L2/3/4 Fem Quad 5 5   DF L4/5 D Peron Tib Ant 5 5   PF S1/2 Tibial Grc/Sol 5 5    Sensation:  Light touch Intact throughout   Pin prick    Temperature    Vibration   Proprioception    Coordination/Complex Motor:  - Finger to Nose intact BL - Heel to shin intact BL - Rapid alternating movement are normal - Gait: deferred.  Labs/Imaging/Neurodiagnostic studies   CBC:  Recent Labs  Lab 01/05/2024 1454  WBC 11.1*  NEUTROABS 8.4*  HGB 17.2*  HCT 49.7  MCV 87.0  PLT 220   Basic Metabolic Panel:  Lab Results  Component Value Date   NA 137 01/05/2024   K 3.7 01/05/2024   CO2 26 01/05/2024   GLUCOSE 103 (H) 01/05/24   BUN 16 2024-01-05   CREATININE 1.03 01/05/24   CALCIUM 10.1 01-05-2024   GFRNONAA >60 01/05/24   Lipid Panel:  Lab Results  Component Value Date   LDLCALC 110 (H) 10/09/2011   HgbA1c: No results found for: HGBA1C Urine Drug Screen: No results found for: LABOPIA, COCAINSCRNUR, LABBENZ, AMPHETMU, THCU, LABBARB  Alcohol Level No results found for: [facility] INR  Lab Results  Component Value Date   INR 0.9 01/05/2024   APTT  Lab Results  Component Value Date   APTT 28 01/05/24   AED levels: No results found for: PHENYTOIN, ZONISAMIDE, LAMOTRIGINE, LEVETIRACETA  CT Head without contrast(Personally reviewed): CTH was negative for a large hypodensity concerning for a large territory infarct or hyperdensity concerning for an ICH  CT angio Head and Neck with  contrast: pending  MRI Brain: pending  ASSESSMENT   Randy Sutton is a 73 y.o. male p/w episode of R sided numbness and weakness along  with speech difficulty that he describes as slurred speech. The episode lasted an hour and then later, had headache.  The description of the episode is clinically concerning for stroke/TIA.  RECOMMENDATIONS  - Frequent Neuro checks per stroke unit protocol - Recommend brain imaging with MRI Brain without contrast - Recommend Vascular imaging with CTA head and neck - Recommend obtaining TTE - Recommend obtaining Lipid panel with LDL - Please start statin if LDL > 70 - Recommend HbA1c to evaluate for diabetes and how well it is controlled. - Antithrombotic - aspirin  81mg  daily along with plavix 75mg  daily x 21days, followed by Aspirin  81mg  daily alone. - Recommend DVT ppx - SBP goal - aim for gradual normotension. - Recommend Telemetry monitoring for arrythmia - Recommend bedside swallow screen prior to PO intake. - Stroke education booklet - Recommend PT/OT/SLP consult - Recommend Urine Tox screen.  ______________________________________________________________________  Plan discussed with Dr. Shona with the Hospitalist team over secure chat.  I personally spent a total of 75 minutes in the care of the patient today including preparing to see the patient, getting/reviewing separately obtained history, performing a medically appropriate exam/evaluation, counseling and educating, placing orders, referring and communicating with other health care professionals, documenting clinical information in the EHR, and independently interpreting results.   Signed, Donell Sliwinski, MD [facility]

## 2023-12-09 NOTE — ED Notes (Signed)
 Patient transported to CT

## 2023-12-09 NOTE — ED Notes (Addendum)
 [facility] at bedside

## 2023-12-09 NOTE — ED Provider Notes (Signed)
 [place] EMERGENCY DEPARTMENT AT [facility] Provider Note   CSN: [id number] Arrival date & time: 12/09/23  1422     Patient presents with: No chief complaint on file.   Randy Sutton is a 73 y.o. male presents today after having approximately 1 to 2 hours of dizziness, aphasia, and right arm numbness last night around 1730.  Patient describes the right arm numbness as cutaneous numbness in the mid forearm and radiating down into the PIP of his 2 fingers.  Patient took a nap and his symptoms resolved.  Patient does report he began to have a headache around 0130 this morning.  Patient has taken Advil and aspirin  prior to arrival and states that his symptoms are very mild now.  Patient denies weakness, diplopia, chest pain, shortness of breath, tinnitus, nausea, fever chills, neck stiffness, or trauma.   HPI     Prior to Admission medications   Medication Sig Start Date End Date Taking? Authorizing Provider  cetirizine (ZYRTEC) 10 MG tablet Take 10 mg by mouth daily.   Yes [provider]  Multiple Vitamin (MULTIVITAMIN) tablet Take 1 tablet by mouth daily.   Yes [provider]  amoxicillin -clavulanate (AUGMENTIN ) 875-125 MG tablet Take 1 tablet by mouth 2 (two) times daily. Patient not taking: Reported on 12/09/2023 06/13/17   Micheal Wolm ORN, MD  traMADol  (ULTRAM ) 50 MG tablet Take 1 tablet (50 mg total) by mouth every 8 (eight) hours as needed. Patient not taking: Reported on 12/09/2023 07/04/16   Rigby, Michael D, DO    Allergies: Patient has no known allergies.    Review of Systems  Neurological:  Positive for dizziness, speech difficulty, numbness and headaches.    Updated Vital Signs BP (!) 180/111   Pulse 99   Temp (!) 97.4 F (36.3 C)   Resp (!) 21   Ht 6' 1 (1.854 m)   Wt 88.5 kg   SpO2 96%   BMI 25.73 kg/m   Physical Exam Vitals and nursing note reviewed.  Constitutional:      General: He is not in acute distress.    Appearance:  Normal appearance. He is well-developed. He is not toxic-appearing.  HENT:     Head: Normocephalic and atraumatic.     Right Ear: External ear normal.     Left Ear: External ear normal.     Nose: No congestion.     Mouth/Throat:     Pharynx: No oropharyngeal exudate or posterior oropharyngeal erythema.  Eyes:     General: No visual field deficit.    Extraocular Movements: Extraocular movements intact.     Conjunctiva/sclera: Conjunctivae normal.     Pupils: Pupils are equal, round, and reactive to light.  Cardiovascular:     Rate and Rhythm: Normal rate and regular rhythm.     Heart sounds: No murmur heard. Pulmonary:     Effort: Pulmonary effort is normal. No respiratory distress.     Breath sounds: Normal breath sounds.  Abdominal:     Palpations: Abdomen is soft.     Tenderness: There is no abdominal tenderness.  Musculoskeletal:        General: No swelling.     Cervical back: Neck supple.  Skin:    General: Skin is warm and dry.     Capillary Refill: Capillary refill takes less than 2 seconds.  Neurological:     General: No focal deficit present.     Mental Status: He is alert and oriented to person, place, and  time. Mental status is at baseline.     GCS: GCS eye subscore is 4. GCS verbal subscore is 5. GCS motor subscore is 6.     Cranial Nerves: No cranial nerve deficit or facial asymmetry.     Sensory: No sensory deficit.     Motor: No weakness or pronator drift.     Coordination: Coordination normal.     Gait: Gait normal.  Psychiatric:        Mood and Affect: Mood normal.     (all labs ordered are listed, but only abnormal results are displayed) Labs Reviewed  CBC - Abnormal; Notable for the following components:      Result Value   WBC 11.1 (*)    Hemoglobin 17.2 (*)    All other components within normal limits  DIFFERENTIAL - Abnormal; Notable for the following components:   Neutro Abs 8.4 (*)    All other components within normal limits  COMPREHENSIVE  METABOLIC PANEL WITH GFR - Abnormal; Notable for the following components:   Glucose, Bld 103 (*)    All other components within normal limits  PROTIME-INR  APTT  URINE DRUG SCREEN  CBG MONITORING, ED    EKG: EKG Interpretation Date/Time:  Tuesday December 09 2023 14:53:04 EST Ventricular Rate:  107 PR Interval:  204 QRS Duration:  92 QT Interval:  354 QTC Calculation: 472 R Axis:   -67  Text Interpretation: Sinus tachycardia Left axis deviation Minimal voltage criteria for LVH, may be normal variant ( Cornell product ) Anteroseptal infarct (cited on or before 08-Feb-2003) Abnormal ECG When compared with ECG of 08-Feb-2003 07:28, Vent. rate has increased BY  36 BPM QRS axis Shifted left Questionable change in initial forces of Anteroseptal leads Confirmed by Dean Clarity [phone]) on 12/09/2023 3:04:19 PM  Radiology: CT HEAD WO CONTRAST Result Date: 12/09/2023 EXAM: CT HEAD WITHOUT CONTRAST 12/09/2023 03:20:49 PM TECHNIQUE: CT of the head was performed without the administration of intravenous contrast. Automated exposure control, iterative reconstruction, and/or weight based adjustment of the mA/kV was utilized to reduce the radiation dose to as low as reasonably achievable. COMPARISON: None available. CLINICAL HISTORY: Neuro deficit, acute, stroke suspected. FINDINGS: BRAIN AND VENTRICLES: There is no evidence of an acute infarct, intracranial hemorrhage, mass, midline shift, hydrocephalus, or extra-axial fluid collection. There is mild cerebral atrophy. 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 Vertebrobasilar dolicoectasia. ORBITS: No acute abnormality. SINUSES: No acute abnormality. SOFT TISSUES AND SKULL: No acute soft tissue abnormality. No skull fracture. IMPRESSION: 1. No acute intracranial  abnormality. 2. Extensive chronic small vessel ischemic disease. Electronically signed by: Dasie Hamburg MD 12/09/2023 03:53 PM EST RP Workstation: [id number]     Procedures   Medications Ordered in the ED  labetalol (NORMODYNE) injection 10 mg (10 mg Intravenous Given 12/09/23 1649)                                    Medical Decision Making Amount and/or Complexity of Data Reviewed Labs: ordered. Radiology: ordered.  Risk Prescription drug management. Decision regarding hospitalization.   This patient presents to the ED for concern of aphasia, headache, dizziness, numbness differential diagnosis includes TIA, stroke, brain bleed, headache    Additional history obtained   Additional history obtained from Electronic Medical  Record External records from outside source obtained and reviewed including medicine notes   Lab Tests:  I Ordered, and personally interpreted labs.  The pertinent results include: CMP unremarkable, CBC with leukocytosis at 11.1, elevated hemoglobin at 17.2, pro time and INR WNL, APTT 28   Imaging Studies ordered:  I ordered imaging studies including CT head Noncon I independently visualized and interpreted imaging which showed no acute intracranial abnormality.  Extensive chronic small vessel ischemic disease. I agree with the radiologist interpretation EKG which showed Sinus tachycardia with LAD   Medicines ordered and prescription drug management:  I ordered medication including labetalol    I have reviewed the patients home medicines and have made adjustments as needed   Problem List / ED Course:  Consulted neurology, Dr. Matthews who recommended admit TIA workup Consulted hospitalist, Dr. Dennise who is agreeable to admission      Final diagnoses:  TIA (transient ischemic attack)    ED Discharge Orders     None          Francis Ileana SAILOR, PA-C 12/09/23 1741    Dean Clarity, MD 12/09/23 2204

## 2023-12-09 NOTE — ED Notes (Signed)
 ED Provider at bedside.

## 2023-12-09 NOTE — ED Triage Notes (Signed)
 Pt reports after leaving work and going to grocery store, around 1730 yesterday afternoon, he began experiencing dizziness, aphasia, and R arm numbness hat resolved once he got home and laid down for ~2 hours. Reports headache onset 0130 last night, ongoing and worsening. Denies weakness.

## 2023-12-10 ENCOUNTER — Observation Stay (HOSPITAL_COMMUNITY)

## 2023-12-10 ENCOUNTER — Other Ambulatory Visit (HOSPITAL_COMMUNITY): Payer: Self-pay

## 2023-12-10 DIAGNOSIS — Z87891 Personal history of nicotine dependence: Secondary | ICD-10-CM | POA: Diagnosis not present

## 2023-12-10 DIAGNOSIS — E785 Hyperlipidemia, unspecified: Secondary | ICD-10-CM

## 2023-12-10 DIAGNOSIS — R29700 NIHSS score 0: Secondary | ICD-10-CM | POA: Diagnosis not present

## 2023-12-10 DIAGNOSIS — I739 Peripheral vascular disease, unspecified: Secondary | ICD-10-CM | POA: Diagnosis not present

## 2023-12-10 DIAGNOSIS — I6381 Other cerebral infarction due to occlusion or stenosis of small artery: Secondary | ICD-10-CM | POA: Diagnosis not present

## 2023-12-10 DIAGNOSIS — I69391 Dysphagia following cerebral infarction: Secondary | ICD-10-CM

## 2023-12-10 DIAGNOSIS — I6389 Other cerebral infarction: Secondary | ICD-10-CM | POA: Diagnosis not present

## 2023-12-10 DIAGNOSIS — Z7982 Long term (current) use of aspirin: Secondary | ICD-10-CM | POA: Diagnosis not present

## 2023-12-10 DIAGNOSIS — Z7902 Long term (current) use of antithrombotics/antiplatelets: Secondary | ICD-10-CM | POA: Diagnosis not present

## 2023-12-10 DIAGNOSIS — I10 Essential (primary) hypertension: Secondary | ICD-10-CM | POA: Diagnosis not present

## 2023-12-10 LAB — BASIC METABOLIC PANEL WITH GFR
Anion gap: 10 (ref 5–15)
BUN: 16 mg/dL (ref 8–23)
CO2: 24 mmol/L (ref 22–32)
Calcium: 8.8 mg/dL — ABNORMAL LOW (ref 8.9–10.3)
Chloride: 103 mmol/L (ref 98–111)
Creatinine, Ser: 1.05 mg/dL (ref 0.61–1.24)
GFR, Estimated: >60 mL/min (ref >60)
Glucose, Bld: 132 mg/dL — ABNORMAL HIGH (ref 70–99)
Potassium: 3.4 mmol/L — ABNORMAL LOW (ref 3.5–5.1)
Sodium: 137 mmol/L (ref 135–145)

## 2023-12-10 LAB — CBC WITH DIFFERENTIAL/PLATELET
Abs Immature Granulocytes: 0.05 K/uL (ref 0.00–0.07)
Basophils Absolute: 0.1 K/uL (ref 0.0–0.1)
Basophils Relative: 1 %
Eosinophils Absolute: 0.1 K/uL (ref 0.0–0.5)
Eosinophils Relative: 2 %
HCT: 43.9 % (ref 39.0–52.0)
Hemoglobin: 15 g/dL (ref 13.0–17.0)
Immature Granulocytes: 1 %
Lymphocytes Relative: 21 %
Lymphs Abs: 1.7 K/uL (ref 0.7–4.0)
MCH: 29.7 pg (ref 26.0–34.0)
MCHC: 34.2 g/dL (ref 30.0–36.0)
MCV: 86.9 fL (ref 80.0–100.0)
Monocytes Absolute: 0.8 K/uL (ref 0.1–1.0)
Monocytes Relative: 10 %
Neutro Abs: 5.1 K/uL (ref 1.7–7.7)
Neutrophils Relative %: 65 %
Platelets: 174 K/uL (ref 150–400)
RBC: 5.05 MIL/uL (ref 4.22–5.81)
RDW: 12.7 % (ref 11.5–15.5)
WBC: 7.8 K/uL (ref 4.0–10.5)
nRBC: 0 % (ref 0.0–0.2)

## 2023-12-10 LAB — CBC
HCT: 46.4 % (ref 39.0–52.0)
Hemoglobin: 15.8 g/dL (ref 13.0–17.0)
MCH: 29.6 pg (ref 26.0–34.0)
MCHC: 34.1 g/dL (ref 30.0–36.0)
MCV: 87.1 fL (ref 80.0–100.0)
Platelets: 203 K/uL (ref 150–400)
RBC: 5.33 MIL/uL (ref 4.22–5.81)
RDW: 12.5 % (ref 11.5–15.5)
WBC: 8.9 K/uL (ref 4.0–10.5)
nRBC: 0 % (ref 0.0–0.2)

## 2023-12-10 LAB — CREATININE, SERUM
Creatinine, Ser: 1.12 mg/dL (ref 0.61–1.24)
GFR, Estimated: >60 mL/min (ref >60)

## 2023-12-10 LAB — ECHOCARDIOGRAM COMPLETE
Height: 73 in
S' Lateral: 3.7 cm
Weight: 3160.51 [oz_av]

## 2023-12-10 LAB — LIPID PANEL
Cholesterol: 186 mg/dL (ref 0–200)
HDL: 38 mg/dL — ABNORMAL LOW (ref >40)
LDL Cholesterol: 119 mg/dL — ABNORMAL HIGH (ref 0–99)
Total CHOL/HDL Ratio: 4.9 ratio
Triglycerides: 143 mg/dL (ref <150)
VLDL: 29 mg/dL (ref 0–40)

## 2023-12-10 LAB — MAGNESIUM: Magnesium: 2.1 mg/dL (ref 1.7–2.4)

## 2023-12-10 LAB — PHOSPHORUS: Phosphorus: 2.9 mg/dL (ref 2.5–4.6)

## 2023-12-10 MED ORDER — AMLODIPINE BESYLATE 5 MG PO TABS
5.0000 mg | ORAL_TABLET | Freq: Every day | ORAL | 1 refills | Status: AC
  Filled 2023-12-10: qty 30, 30d supply, fill #0

## 2023-12-10 MED ORDER — IOHEXOL 350 MG/ML SOLN
75.0000 mL | Freq: Once | INTRAVENOUS | Status: AC | PRN
  Administered 2023-12-10: 75 mL via INTRAVENOUS

## 2023-12-10 MED ORDER — POTASSIUM CHLORIDE CRYS ER 20 MEQ PO TBCR
40.0000 meq | EXTENDED_RELEASE_TABLET | Freq: Once | ORAL | Status: AC
  Administered 2023-12-10: 40 meq via ORAL
  Filled 2023-12-10: qty 2

## 2023-12-10 MED ORDER — CLOPIDOGREL BISULFATE 75 MG PO TABS
75.0000 mg | ORAL_TABLET | Freq: Every day | ORAL | 0 refills | Status: AC
  Filled 2023-12-10: qty 20, 20d supply, fill #0

## 2023-12-10 MED ORDER — ASPIRIN 81 MG PO TBEC
81.0000 mg | DELAYED_RELEASE_TABLET | Freq: Every day | ORAL | 12 refills | Status: AC
  Filled 2023-12-10: qty 30, 30d supply, fill #0

## 2023-12-10 MED ORDER — AMLODIPINE BESYLATE 5 MG PO TABS
5.0000 mg | ORAL_TABLET | Freq: Every day | ORAL | Status: DC
  Administered 2023-12-10: 5 mg via ORAL
  Filled 2023-12-10: qty 1

## 2023-12-10 MED ORDER — ATORVASTATIN CALCIUM 80 MG PO TABS
80.0000 mg | ORAL_TABLET | Freq: Every day | ORAL | 1 refills | Status: AC
  Filled 2023-12-10: qty 30, 30d supply, fill #0

## 2023-12-10 MED ORDER — LABETALOL HCL 5 MG/ML IV SOLN: 5.0000 mg | INTRAVENOUS | Status: DC | PRN

## 2023-12-10 MED ORDER — INFLUENZA VAC SPLIT HIGH-DOSE 0.5 ML IM SUSY: 0.5000 mL | PREFILLED_SYRINGE | INTRAMUSCULAR | Status: DC

## 2023-12-10 MED ORDER — ATORVASTATIN CALCIUM 80 MG PO TABS
80.0000 mg | ORAL_TABLET | Freq: Every day | ORAL | Status: DC
  Administered 2023-12-10: 80 mg via ORAL
  Filled 2023-12-10: qty 1

## 2023-12-10 MED ORDER — PNEUMOCOCCAL 20-VAL CONJ VACC 0.5 ML IM SUSY: 0.5000 mL | PREFILLED_SYRINGE | INTRAMUSCULAR | Status: DC

## 2023-12-10 NOTE — Progress Notes (Addendum)
 STROKE TEAM PROGRESS NOTE    SIGNIFICANT HOSPITAL EVENTS 11/4 patient presented with right-sided numbness and weakness which lasted for about 30 minutes.  MRI brain with small acute/subacute left posterior frontal infarct  INTERIM HISTORY/SUBJECTIVE No family at the bedside.  Patient laying in the bed in no apparent distress. He states that he had about 30 minutes of right side numbness and weakness which has resolved.  He also states that he had a similar episode in July with right side numbness and weakness and some slurred speech that lasted 30 minutes as well. Dr.Emmalise Huard woke to him about the Librexia stroke trial study and he was given a brochure to read, he will notify us  if he has decided to enroll in the study or not Recommend aspirin  and Plavix for 3 weeks then aspirin  alone  CBC    Component Value Date/Time   WBC 7.8 12/10/2023 0201   RBC 5.05 12/10/2023 0201   HGB 15.0 12/10/2023 0201   HCT 43.9 12/10/2023 0201   PLT 174 12/10/2023 0201   MCV 86.9 12/10/2023 0201   MCH 29.7 12/10/2023 0201   MCHC 34.2 12/10/2023 0201   RDW 12.7 12/10/2023 0201   LYMPHSABS 1.7 12/10/2023 0201   MONOABS 0.8 12/10/2023 0201   EOSABS 0.1 12/10/2023 0201   BASOSABS 0.1 12/10/2023 0201    BMET    Component Value Date/Time   NA 137 12/10/2023 0201   K 3.4 (L) 12/10/2023 0201   CL 103 12/10/2023 0201   CO2 24 12/10/2023 0201   GLUCOSE 132 (H) 12/10/2023 0201   BUN 16 12/10/2023 0201   CREATININE 1.05 12/10/2023 0201   CALCIUM 8.8 (L) 12/10/2023 0201   GFRNONAA >60 12/10/2023 0201    IMAGING past 24 hours MR BRAIN WO CONTRAST Result Date: 12/10/2023 EXAM: MRI Brain Without Contrast 12/10/2023 12:40:38 AM TECHNIQUE: Multiplanar multisequence MRI of the head/brain was performed without the administration of intravenous contrast. COMPARISON: CT head 12/09/2023 CLINICAL HISTORY: Neuro deficit, acute, stroke suspected FINDINGS: BRAIN AND VENTRICLES: Small acute or subacute infarct in the left  posterior frontal white matter. No acute intracranial hemorrhage. No mass. No midline shift. No hydrocephalus. Normal flow voids. Moderate T2 hyperintensities in the white matter, compatible with chronic microvascular ischemic change. ORBITS: No acute abnormality. SINUSES AND MASTOIDS: No acute abnormality. BONES AND SOFT TISSUES: Normal marrow signal. No acute soft tissue abnormality. IMPRESSION: 1. Small acute or subacute infarct in the left posterior frontal white matter. Electronically signed by: Gilmore Molt MD 12/10/2023 03:22 AM EST RP Workstation: [id number]   CT ANGIO HEAD NECK W WO CM Result Date: 12/10/2023 EXAM: CTA HEAD AND NECK WITHOUT AND WITH 12/10/2023 12:15:09 AM TECHNIQUE: CTA of the head and neck was performed without and with the administration of intravenous contrast. Multiplanar 2D and/or 3D reformatted images are provided for review. Automated exposure control, iterative reconstruction, and/or weight based adjustment of the mA/kV was utilized to reduce the radiation dose to as low as reasonably achievable. Stenosis of the internal carotid arteries measured using NASCET criteria. COMPARISON: None available CLINICAL HISTORY: Neuro deficit, acute, stroke suspected FINDINGS: AORTIC ARCH AND ARCH VESSELS: No dissection or arterial injury. No significant stenosis of the brachiocephalic or subclavian arteries. CERVICAL CAROTID ARTERIES: No dissection, arterial injury, or hemodynamically significant stenosis by NASCET criteria. CERVICAL VERTEBRAL ARTERIES: No dissection, arterial injury, or significant stenosis. LUNGS AND MEDIASTINUM: Unremarkable. SOFT TISSUES: No acute abnormality. BONES: No acute abnormality. ANTERIOR CIRCULATION: No significant stenosis of the internal carotid arteries. Severe left clA3 ACA  stenosis. No significant stenosis of the middle cerebral arteries. No aneurysm. POSTERIOR CIRCULATION: Severe distal left P2 PCA stenosis. No significant stenosis of the basilar artery.  No significant stenosis of the vertebral arteries. No aneurysm. OTHER: No dural venous sinus thrombosis on this non-dedicated study. IMPRESSION: 1. No large vessel occlusion. 2. Severe left A3 ACA stenosis. 3. Severe distal left P2 PCA stenosis. Electronically signed by: Gilmore Molt MD 12/10/2023 02:05 AM EST RP Workstation: [id number]   CT HEAD WO CONTRAST Result Date: 12/09/2023 EXAM: CT HEAD WITHOUT CONTRAST 12/09/2023 03:20:49 PM TECHNIQUE: CT of the head was performed without the administration of intravenous contrast. Automated exposure control, iterative reconstruction, and/or weight based adjustment of the mA/kV was utilized to reduce the radiation dose to as low as reasonably achievable. COMPARISON: None available. CLINICAL HISTORY: Neuro deficit, acute, stroke suspected. FINDINGS: BRAIN AND VENTRICLES: There is no evidence of an acute infarct, intracranial hemorrhage, mass, midline shift, hydrocephalus, or extra-axial fluid collection. There is mild cerebral atrophy. 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 Vertebrobasilar dolicoectasia. ORBITS: No acute abnormality. SINUSES: No acute abnormality. SOFT TISSUES AND SKULL: No acute soft tissue abnormality. No skull fracture. IMPRESSION: 1. No acute intracranial abnormality. 2. Extensive chronic small vessel ischemic disease. Electronically signed by: Dasie Hamburg MD 12/09/2023 03:53 PM EST RP Workstation: [id number]    Vitals:   12/09/23 2355 12/10/23 0143 12/10/23 0149 12/10/23 0400  BP: (!) 176/96 (!) 178/103  (!) 173/96  Pulse: 78 69 67 65  Resp: 20 17 19 16   Temp:   98.9 F (37.2 C) 98.4 F (36.9 C)  TempSrc:   Oral Oral  SpO2: 96% 94% 95% 97%  Weight:      Height:         PHYSICAL EXAM General:  Alert, well-nourished, well-developed  patient in no acute distress Psych:  Mood and affect appropriate for situation CV: Regular rate and rhythm on monitor Respiratory:  Regular, unlabored respirations on room air GI: Abdomen soft and nontender   NEURO:  Mental Status: AA&Ox3, patient is able to give clear and coherent history Speech/Language: speech is without dysarthria or aphasia.  Naming, repetition, fluency, and comprehension intact.  Cranial Nerves:  II: PERRL. Visual fields full.  III, IV, VI: EOMI. Eyelids elevate symmetrically.  V: Sensation is intact to light touch and symmetrical to face.  VII: Face is symmetrical resting and smiling VIII: hearing intact to voice. IX, X: Palate elevates symmetrically. Phonation is normal.  KP:Dynloizm shrug 5/5. XII: tongue is midline without fasciculations. Motor: 5/5 strength to all muscle groups tested.  Tone: is normal and bulk is normal Sensation- Intact to light touch bilaterally. Extinction absent to light touch to DSS.   Coordination: FTN intact bilaterally, HKS: no ataxia in BLE.No drift.  Gait- deferred  Most Recent NIH 0   ASSESSMENT/PLAN  Mr. Randy Sutton is a 73 y.o. male with history of  no significant hx who presents with R sided numbness weakness and slurred speech along with feeling dizzy.  MRI brain with small acute/subacute left posterior frontal infarct   NIH on Admission 0  Acute Ischemic Infarct:  left posterior frontal Etiology: Small vessel disease CT head No acute abnormality.  Small vessel disease.  CTA head & neck no LVO.  Severe left A 3 stenosis, severe distal left P2 stenosis MRI  Small  acute or subacute infarct in the left posterior frontal white matter.  2D Echo ordered LDL 119 HgbA1c 5.0 VTE prophylaxis -Lovenox No antithrombotic prior to admission, now on aspirin  81 mg daily and clopidogrel 75 mg daily for 3 weeks and then aspirin  alone. Therapy recommendations:  Pending Disposition: Pending  Hypertension Home meds:  None UnStable-on the high side Goal is normotensive  Hyperlipidemia Home meds: None LDL 119, goal < 70 Add atorvastatin 80 mg Continue statin at discharge  Tobacco Abuse Former cigarette smoker quit 15 years ago   Dysphagia Patient has post-stroke dysphagia, SLP consulted    Diet   Diet Heart Fluid consistency: Thin   Advance diet as tolerated  Other Stroke Risk Factors ETOH use., advised to drink no more than 2 drink(s) a day  Hospital day # 0   Karna Geralds DNP, ACNPC-AG  [facility]  I have personally obtained history,examined this patient, reviewed notes, independently viewed imaging studies, participated in medical decision making and plan of care.ROS completed by me personally and pertinent positives fully documented  I have made any additions or clarifications directly to the above note. Agree with note above.  Patient presented with sudden onset of right face and hand paresthesias and weakness which lasted around 30 minutes and resolved.  MRI scan showed small left frontal white matter lacunar infarct likely from small vessel disease.  Recommend aspirin  and Plavix for 3 weeks followed by aspirin  alone and aggressive risk factor modifications.  Start blood pressure medications and statin for elevated lipids.  Patient has not been to a primary care physician for few years and will need to be set up with a new PCP.  Patient initially expressed some interest to learn about participation in Librexia stroke trial and was given information but after review he decided not to participate.  Discussed with Dr. Drusilla   I personally spent a total of 50 minutes in the care of the patient today including getting/reviewing separately obtained history, performing a medically appropriate exam/evaluation, counseling and educating, placing orders, referring and communicating with other health care professionals, documenting clinical information in the EHR, independently interpreting  results, and coordinating care.        Eather Popp, MD Medical Director [facility] Pager: [phone] 12/10/2023 2:33 PM    To contact Stroke Continuity provider, please refer to [email or web address]. After hours, contact General Neurology

## 2023-12-10 NOTE — Evaluation (Signed)
 Occupational Therapy Evaluation Patient Details Name: Randy Sutton MRN: [id number] DOB: 09-02-1950 Today's Date: 12/10/2023   History of Present Illness   Pt is a 73 y/o M who presented to [facility] after ~1 hour episode of RHB numbness/weakness, dizziness, and speech difficulty. MRI revealed small acute or subacute infarct in L posterior frontal white matter. CTA showed severe L ACA and PCA stenosis. No significant PMHx documented; of note, pt has not seen PCP in 3+ years.     Clinical Impressions Pt received in supine, agreeable for OT visit. AOX4, pleasant & participatory. PTA, pt was living alone and was fully independent with ADLs, IADLs, and working full time at a [employer]. He presents today with intact strength and sensation x4 extremities. BUE coordination equal. Functionally, he is mod I for all UB/LB ADLs and supervision for mobility without AD. Occasionally reaching for hand rail for stability in hall. Hypertensive, asymptomatic; denies dizziness with head turns simulating grocery shopping. Given pt is near his baseline, acute OT to sign-off, no follow-up OT Sutton.  BP measurements:  Supine: 171/106  Seated EOB: 165/103     If plan is discharge home, recommend the following:         Functional Status Assessment         Equipment Recommendations   None recommended by OT     Recommendations for Other Services         Precautions/Restrictions   Precautions Precautions: Fall Recall of Precautions/Restrictions: Intact Precaution/Restrictions Comments: pHTN <220/120 Restrictions Weight Bearing Restrictions Per Provider Order: No     Mobility Bed Mobility Overal bed mobility: Independent                  Transfers Overall transfer level: Independent                        Balance Overall balance assessment: Mild deficits observed, not formally tested                                         ADL either performed  or assessed with clinical judgement   ADL Overall ADL's : Modified independent                                             Vision Baseline Vision/History:  (wears contacts) Ability to See in Adequate Light: 0 Adequate Patient Visual Report: No change from baseline Vision Assessment?: No apparent visual deficits     Perception         Praxis         Pertinent Vitals/Pain Pain Assessment Pain Assessment: No/denies pain     Extremity/Trunk Assessment Upper Extremity Assessment Upper Extremity Assessment: Right hand dominant;Overall [facility] for tasks assessed   Lower Extremity Assessment Lower Extremity Assessment: Overall WFL for tasks assessed   Cervical / Trunk Assessment Cervical / Trunk Assessment: Normal   Communication Communication Communication: No apparent difficulties   Cognition Arousal: Alert Behavior During Therapy: WFL for tasks assessed/performed Cognition: No apparent impairments                               Following commands: Intact       Cueing  General Comments  Cueing Techniques: Verbal cues  hypertensive but within parameters for pHTN   Exercises     Shoulder Instructions      Home Living Family/patient expects to be discharged to:: Private residence Living Arrangements: Alone (wife passed ~15 months ago) Available Help at Discharge: Family;Neighbor;Available PRN/intermittently Type of Home: House       Home Layout: One level     Bathroom Shower/Tub: Producer, Television/film/video: Standard     Home Equipment: None          Prior Functioning/Environment Prior Level of Function : Independent/Modified Independent;Driving             Mobility Comments: none PTA ADLs Comments: works full time at a [employer], very active    OT Problem List: Impaired balance (sitting and/or standing)   OT Treatment/Interventions:        OT Goals(Current goals can be found in the care  plan section)   Acute Rehab OT Goals Patient Stated Goal: go home   OT Frequency:       Co-evaluation              AM-PAC OT 6 Clicks Daily Activity     Outcome Measure Help from another person eating meals?: None Help from another person taking care of personal grooming?: None Help from another person toileting, which includes using toliet, bedpan, or urinal?: None Help from another person bathing (including washing, rinsing, drying)?: None Help from another person to put on and taking off regular upper body clothing?: None Help from another person to put on and taking off regular lower body clothing?: None 6 Click Score: 24   End of Session Nurse Communication: Mobility status  Activity Tolerance: Patient tolerated treatment well Patient left: in chair;with call bell/phone within reach  OT Visit Diagnosis: Unsteadiness on feet (R26.81);Muscle weakness (generalized) (M62.81)                Time: 9199-9176 OT Time Calculation (min): 23 min Charges:  OT General Charges $OT Visit: 1 Visit OT Evaluation $OT Eval Low Complexity: 1 Low  Randy Sutton., MSOT, OTR/L Acute Rehabilitation Services [phone] Secure Chat Preferred  Randy Sutton 12/10/2023, 8:28 AM

## 2023-12-10 NOTE — Evaluation (Signed)
 Physical Therapy Evaluation Patient Details Name: Randy Sutton MRN: [id number] DOB: 1950-08-13 Today's Date: 12/10/2023  History of Present Illness  Pt is a 73 y/o M who presented to [facility] after ~1 hour episode of RHB numbness/weakness, dizziness, and speech difficulty. MRI revealed small acute or subacute infarct in L posterior frontal white matter. CTA showed severe L ACA and PCA stenosis. No significant PMHx documented; of note, pt has not seen PCP in 3+ years.   Clinical Impression  PTA pt was independent for mobility with no AD. Pt presents at functional mobility baseline with ability to ambulate 451ft with ModI and no AD. Pt scored a 19/24 on the DGI indicating that pt is not at an increased risk of falling. Educated pt on BE FAST acronym with pt verbalizing understanding. Pt feels comfortable d/c home whenever medically stable with no further questions/concerns. No further acute or post-acute PT needs identified. Acute PT signing off. Please re-consult if there are any changes in status.         If plan is discharge home, recommend the following: Assist for transportation   Can travel by private vehicle    Yes    Equipment Recommendations None recommended by PT     Functional Status Assessment Patient has not had a recent decline in their functional status     Precautions / Restrictions Precautions Precautions: Fall Recall of Precautions/Restrictions: Intact Precaution/Restrictions Comments: pHTN <220/120 Restrictions Weight Bearing Restrictions Per Provider Order: No      Mobility  Bed Mobility Overal bed mobility: Independent   Transfers Overall transfer level: Independent     Ambulation/Gait Ambulation/Gait assistance: Modified independent (Device/Increase time) Gait Distance (Feet): 400 Feet Assistive device: None Gait Pattern/deviations: WFL(Within Functional Limits)  General Gait Details: steady with challenges to balance, no AD  Stairs Stairs:  Yes Stairs assistance: Modified independent (Device/Increase time) Stair Management: One rail Left, Alternating pattern, Forwards Number of Stairs: 2      Balance Overall balance assessment: Independent     Dynamic Gait Index Level Surface: Normal Change in Gait Speed: Mild Impairment Gait with Horizontal Head Turns: Normal Gait with Vertical Head Turns: Normal Gait and Pivot Turn: Normal Step Over Obstacle: Mild Impairment Step Around Obstacles: Mild Impairment Steps: Moderate Impairment Total Score: 19       Pertinent Vitals/Pain Pain Assessment Pain Assessment: No/denies pain    Home Living Family/patient expects to be discharged to:: Private residence Living Arrangements: Alone (wife passed ~15 months ago) Available Help at Discharge: Family;Neighbor;Available PRN/intermittently Type of Home: House         Home Layout: One level Home Equipment: None      Prior Function Prior Level of Function : Independent/Modified Independent;Driving  Mobility Comments: none PTA ADLs Comments: works full time at a [employer], very active     Extremity/Trunk Assessment   Upper Extremity Assessment Upper Extremity Assessment: Defer to OT evaluation    Lower Extremity Assessment Lower Extremity Assessment: Overall WFL for tasks assessed    Cervical / Trunk Assessment Cervical / Trunk Assessment: Normal  Communication   Communication Communication: No apparent difficulties    Cognition Arousal: Alert Behavior During Therapy: WFL for tasks assessed/performed   PT - Cognitive impairments: No apparent impairments      Following commands: Intact       Cueing Cueing Techniques: Verbal cues     General Comments General comments (skin integrity, edema, etc.): Educated on BE FAST acronym     PT Assessment Patient does not need  any further PT services         PT Goals (Current goals can be found in the Care Plan section)  Acute Rehab PT Goals PT  Goal Formulation: All assessment and education complete, DC therapy     AM-PAC PT 6 Clicks Mobility  Outcome Measure Help needed turning from your back to your side while in a flat bed without using bedrails?: None Help needed moving from lying on your back to sitting on the side of a flat bed without using bedrails?: None Help needed moving to and from a bed to a chair (including a wheelchair)?: None Help needed standing up from a chair using your arms (e.g., wheelchair or bedside chair)?: None Help needed to walk in hospital room?: None Help needed climbing 3-5 steps with a railing? : None 6 Click Score: 24    End of Session Equipment Utilized During Treatment: Gait belt Activity Tolerance: Patient tolerated treatment well Patient left: in bed;with call bell/phone within reach Nurse Communication: Mobility status PT Visit Diagnosis: Other abnormalities of gait and mobility (R26.89)    Time: 1135-1150 PT Time Calculation (min) (ACUTE ONLY): 15 min   Charges:   PT Evaluation $PT Eval Low Complexity: 1 Low   PT General Charges $$ ACUTE PT VISIT: 1 Visit        Kate ORN, PT, DPT Secure Chat Preferred  Rehab Office [phone]   Kate BRAVO Wendolyn 12/10/2023, 12:07 PM

## 2023-12-10 NOTE — Plan of Care (Signed)

## 2023-12-10 NOTE — Plan of Care (Signed)
  Problem: Education: Goal: Knowledge of disease or condition will improve Outcome: Progressing   Problem: Ischemic Stroke/TIA Tissue Perfusion: Goal: Complications of ischemic stroke/TIA will be minimized Outcome: Progressing   Problem: Health Behavior/Discharge Planning: Goal: Ability to manage health-related needs will improve Outcome: Progressing   

## 2023-12-10 NOTE — TOC Transition Note (Signed)
 Transition of Care [facility]) - Discharge Note   Patient Details  Name: Randy Sutton MRN: [id number] Date of Birth: [birth year]  Transition of Care [facility]) CM/SW Contact:  Andrez JULIANNA George, RN Phone Number: 12/10/2023, 4:18 PM   Clinical Narrative:     Pt is discharging home with self care. No follow up per therapies. Pt has transportation home.  Final next level of care: Home/Self Care Barriers to Discharge: No Barriers Identified   Patient Goals and CMS Choice            Discharge Placement                       Discharge Plan and Services Additional resources added to the After Visit Summary for     Discharge Planning Services: CM Consult                                 Social Drivers of Health (SDOH) Interventions SDOH Screenings   Food Insecurity: No Food Insecurity (12/09/2023)  Housing: Low Risk  (12/09/2023)  Transportation Needs: No Transportation Needs (12/09/2023)  Utilities: Not At Risk (12/09/2023)  Social Connections: Socially Isolated (12/09/2023)  Tobacco Use: Medium Risk (12/09/2023)     Readmission Risk Interventions     No data to display

## 2023-12-10 NOTE — Progress Notes (Signed)
 Echocardiogram 2D Echocardiogram has been performed.  Juliene JINNY Rucks 12/10/2023, 11:15 AM

## 2023-12-10 NOTE — TOC Initial Note (Signed)
 Transition of Care [facility]) - Initial/Assessment Note    Patient Details  Name: Randy Sutton MRN: [id number] Date of Birth: May 07, 1950  Transition of [facility]) CM/SW Contact:    Landry DELENA Senters, RN Phone Number: 12/10/2023, 11:18 AM  Clinical Narrative:                 Chief Complaint: Right-sided numbness, weakness, slurred speech.  Patient from home alone. Patient reports coworkers can check on him if needed. Drives self, manages own medications. No insurance in system, copy of cards sent to admissions to be entered.   No PCP. Patient willing for CM to assist. Appt for PCP added to AVS.   Patient to work on transportation home when d/c.   IP care management following.  Expected Discharge Plan: Home/Self Care Barriers to Discharge: Continued Medical Work up   Patient Goals and CMS Choice            Expected Discharge Plan and Services   Discharge Planning Services: CM Consult   Living arrangements for the past 2 months: Single Family Home                                      Prior Living Arrangements/Services Living arrangements for the past 2 months: Single Family Home Lives with:: Self Patient language and need for interpreter reviewed:: Yes Do you feel safe going back to the place where you live?: Yes      Need for Family Participation in Patient Care: No (Comment) Care giver support system in place?: No (comment) Current home services: DME (wheelchair) Criminal Activity/Legal Involvement Pertinent to Current Situation/Hospitalization: No - Comment as needed  Activities of Daily Living   ADL Screening (condition at time of admission) Independently performs ADLs?: Yes (appropriate for developmental age) Is the patient deaf or have difficulty hearing?: No Does the patient have difficulty seeing, even when wearing glasses/contacts?: No Does the patient have difficulty concentrating, remembering, or making decisions?: No  Permission Sought/Granted                   Emotional Assessment Appearance:: Developmentally appropriate Attitude/Demeanor/Rapport: Engaged Affect (typically observed): Appropriate Orientation: : Oriented to Self, Oriented to Place, Oriented to  Time, Oriented to Situation Alcohol / Substance Use: Not Applicable Psych Involvement: No (comment)  Admission diagnosis:  TIA (transient ischemic attack) [G45.9] Patient Active Problem List   Diagnosis Date Noted   TIA (transient ischemic attack) 12/09/2023   Chronic right shoulder pain 06/12/2016   Poison ivy dermatitis 05/12/2013   Acute sinus infection 10/29/2012   Allergic rhinitis 10/09/2011   Umbilical hernia    Erectile dysfunction    Preventative health care 04/09/2011   History of colonic polyps 08/08/2009   PCP:  Pcp, No Pharmacy:   [id number] - [place] - [address] [place] [address] Phone: [phone] Fax: [phone]  [id number] [place] [facility] - [address] [place] [address] Phone: [phone] Fax: [phone]     Social Drivers of Health (SDOH) Social History: SDOH Screenings   Food Insecurity: No Food Insecurity (12/09/2023)  Housing: Low Risk  (12/09/2023)  Transportation Needs: No Transportation Needs (12/09/2023)  Utilities: Not At Risk (12/09/2023)  Social Connections: Socially Isolated (12/09/2023)  Tobacco Use: Medium Risk (12/09/2023)   SDOH Interventions:     Readmission Risk Interventions  No data to display

## 2023-12-10 NOTE — Discharge Summary (Signed)
 Physician Discharge Summary   Patient: Randy Sutton MRN: [id number] DOB: 1950/03/24  Admit date:     12/09/2023  Discharge date: 12/10/23  Discharge Physician: Sabas GORMAN Brod   PCP: Pcp, No   Recommendations at discharge:   Follow-up PCP in 1 week  Discharge Diagnoses: Principal Problem:   TIA (transient ischemic attack)  Resolved Problems:   * No resolved hospital problems. *  Hospital Course: 73 y.o. male with no significant past medical history who presents to [facility] ED with complaints of right-sided numbness and tingling, associated with slurred speech.  States this happened once in the past in July 2025, the exact same symptoms, lasting roughly an hour.  States around 5:15 PM on 12/08/2023 he went to the store and he had these symptoms of right sided numbness and tingling.  Also had slurred speech.  The symptoms lasted about an hour.  He returned home and went to sleep and he was awakened by a terrible headache around 2:30 AM.  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 - CT head and neck showed no large vessel occlusion, severe left A3 stenosis, severe distal left P2 stenosis - MRI brain showed small acute or subacute infarct in left posterior frontal white matter - 2D echocardiogram showed grade 1 diastolic dysfunction - LDL 119 - Patient started on atorvastatin, - Plan to start aspirin  and Plavix for 3 weeks and then aspirin  alone - No PT follow-up recommended by physical therapy  Hypertension - Uncontrolled hypertension - Will start amlodipine 5 mg daily - Will follow-up with PCP in 1 week to adjust medications for hypertension  Hyperlipidemia - Started on atorvastatin as above  Hypokalemia - Potassium is 3.4, will replace potassium before discharge          Consultants: Neurology Procedures performed:   Disposition: Home Diet recommendation:  Discharge Diet Orders  (From admission, onward)     Start     Ordered   12/10/23 0000  Diet - low sodium heart healthy        12/10/23 1548           Cardiac diet DISCHARGE MEDICATION: Allergies as of 12/10/2023   No Known Allergies      Medication List     STOP taking these medications    amoxicillin -clavulanate 875-125 MG tablet Commonly known as: AUGMENTIN    traMADol  50 MG tablet Commonly known as: ULTRAM        TAKE these medications    amLODipine 5 MG tablet Commonly known as: NORVASC Take 1 tablet (5 mg total) by mouth daily. Start taking on: December 11, 2023   aspirin  EC 81 MG tablet Take 1 tablet (81 mg total) by mouth daily. Swallow whole. Take aspirin  and Plavix together for 3 weeks, then stop Plavix and continue taking aspirin  Start taking on: December 11, 2023   atorvastatin 80 MG tablet Commonly known as: LIPITOR Take 1 tablet (80 mg total) by mouth daily. Start taking on: December 11, 2023   cetirizine 10 MG tablet Commonly known as: ZYRTEC Take 10 mg by mouth daily.   clopidogrel 75 MG tablet Commonly known as: PLAVIX Take 1 tablet (75 mg total) by mouth daily for 20 days. Start taking on: December 11, 2023   multivitamin tablet Take 1 tablet by mouth daily.        Follow-up Information     [facility] Follow up on  12/18/2023.   Why: Your appointment is at 3pm. please arrive early and bring photo ID, insurnace card and home medications Contact information: [address] [place], [place] [address]  Phone: [phone]               Discharge Exam: Fredricka Weights   12/09/23 1448 12/09/23 2215  Weight: 88.5 kg 89.6 kg   General-appears in no acute distress Heart-S1-S2, regular, no murmur auscultated Lungs-clear to auscultation bilaterally, no wheezing or crackles auscultated Abdomen-soft, nontender, no organomegaly Extremities-no edema in the lower extremities Neuro-alert, oriented x3, no focal deficit noted  Condition at  discharge: good  The results of significant diagnostics from this hospitalization (including imaging, microbiology, ancillary and laboratory) are listed below for reference.   Imaging Studies: ECHOCARDIOGRAM COMPLETE Result Date: 12/10/2023    ECHOCARDIOGRAM REPORT   Patient Name:   Randy Sutton Date of Exam: 12/10/2023 Medical Rec #:  [id number]     Height:       73.0 in Accession #:    [id number]    Weight:       197.5 lb Date of Birth:  Nov 24, 1950    BSA:          2.140 m Patient Age:    72 years      BP:           156/110 mmHg Patient Gender: M             HR:           73 bpm. Exam Location:  Inpatient Procedure: 2D Echo, Cardiac Doppler and Color Doppler (Both Spectral and Color            Flow Doppler were utilized during procedure). Indications:    TIA  History:        Patient has no prior history of Echocardiogram examinations.                 TIA, Signs/Symptoms:Dizziness/Lightheadedness; Risk                 Factors:Hypertension and Former Smoker.  Sonographer:    Juliene Rucks Referring Phys: [id number] CAROLE N HALL  Sonographer Comments: Technically difficult study due to poor echo windows. Image acquisition challenging due to respiratory motion. IMPRESSIONS  1. Left ventricular ejection fraction, by estimation, is 55 to 60%. The left ventricle has normal function. The left ventricle has no regional wall motion abnormalities. There is mild concentric left ventricular hypertrophy. Left ventricular diastolic parameters are consistent with Grade I diastolic dysfunction (impaired relaxation).  2. Right ventricular systolic function is normal. The right ventricular size is normal.  3. The mitral valve is normal in structure. No evidence of mitral valve regurgitation. No evidence of mitral stenosis.  4. The aortic valve is tricuspid. Aortic valve regurgitation is not visualized. No aortic stenosis is present.  5. The inferior vena cava is normal in size with greater than 50% respiratory variability,  suggesting right atrial pressure of 3 mmHg. FINDINGS  Left Ventricle: Left ventricular ejection fraction, by estimation, is 55 to 60%. The left ventricle has normal function. The left ventricle has no regional wall motion abnormalities. The left ventricular internal cavity size was normal in size. There is  mild concentric left ventricular hypertrophy. Left ventricular diastolic parameters are consistent with Grade I diastolic dysfunction (impaired relaxation). Normal left ventricular filling pressure. Right Ventricle: The right ventricular size is normal. No increase in right ventricular wall thickness. Right ventricular systolic function is normal.  Left Atrium: Left atrial size was normal in size. Right Atrium: Right atrial size was normal in size. Pericardium: There is no evidence of pericardial effusion. Mitral Valve: The mitral valve is normal in structure. No evidence of mitral valve regurgitation. No evidence of mitral valve stenosis. Tricuspid Valve: The tricuspid valve is normal in structure. Tricuspid valve regurgitation is not demonstrated. No evidence of tricuspid stenosis. Aortic Valve: The aortic valve is tricuspid. Aortic valve regurgitation is not visualized. No aortic stenosis is present. Pulmonic Valve: The pulmonic valve was normal in structure. Pulmonic valve regurgitation is not visualized. No evidence of pulmonic stenosis. Aorta: The aortic root is normal in size and structure. Venous: The inferior vena cava is normal in size with greater than 50% respiratory variability, suggesting right atrial pressure of 3 mmHg. IAS/Shunts: No atrial level shunt detected by color flow Doppler.  LEFT VENTRICLE PLAX 2D LVIDd:         5.30 cm   Diastology LVIDs:         3.70 cm   LV e' medial:  5.55 cm/s LV PW:         1.20 cm   LV e' lateral: 6.42 cm/s LV IVS:        1.30 cm LVOT diam:     2.50 cm LV SV:         92 LV SV Index:   43 LVOT Area:     4.91 cm  RIGHT VENTRICLE             IVC RV Basal diam:  2.60  cm     IVC diam: 1.00 cm RV Mid diam:    2.10 cm RV S prime:     10.20 cm/s LEFT ATRIUM             Index        RIGHT ATRIUM           Index LA diam:        3.80 cm 1.78 cm/m   RA Area:     11.60 cm LA Vol (A2C):   31.4 ml 14.67 ml/m  RA Volume:   22.10 ml  10.33 ml/m LA Vol (A4C):   35.2 ml 16.45 ml/m LA Biplane Vol: 35.8 ml 16.73 ml/m  AORTIC VALVE LVOT Vmax:   96.90 cm/s LVOT Vmean:  67.600 cm/s LVOT VTI:    0.187 m  AORTA Ao Root diam: 3.80 cm Ao Asc diam:  3.30 cm  SHUNTS Systemic VTI:  0.19 m Systemic Diam: 2.50 cm Annabella Scarce MD Electronically signed by Annabella Scarce MD Signature Date/Time: 12/10/2023/2:46:55 PM    Final    MR BRAIN WO CONTRAST Result Date: 12/10/2023 EXAM: MRI Brain Without Contrast 12/10/2023 12:40:38 AM TECHNIQUE: Multiplanar multisequence MRI of the head/brain was performed without the administration of intravenous contrast. COMPARISON: CT head 12/09/2023 CLINICAL HISTORY: Neuro deficit, acute, stroke suspected FINDINGS: BRAIN AND VENTRICLES: Small acute or subacute infarct in the left posterior frontal white matter. No acute intracranial hemorrhage. No mass. No midline shift. No hydrocephalus. Normal flow voids. Moderate T2 hyperintensities in the white matter, compatible with chronic microvascular ischemic change. ORBITS: No acute abnormality. SINUSES AND MASTOIDS: No acute abnormality. BONES AND SOFT TISSUES: Normal marrow signal. No acute soft tissue abnormality. IMPRESSION: 1. Small acute or subacute infarct in the left posterior frontal white matter. Electronically signed by: Gilmore Molt MD 12/10/2023 03:22 AM EST RP Workstation: [id number]   CT ANGIO HEAD NECK W WO CM Result Date: 12/10/2023 EXAM:  CTA HEAD AND NECK WITHOUT AND WITH 12/10/2023 12:15:09 AM TECHNIQUE: CTA of the head and neck was performed without and with the administration of intravenous contrast. Multiplanar 2D and/or 3D reformatted images are provided for review. Automated exposure control,  iterative reconstruction, and/or weight based adjustment of the mA/kV was utilized to reduce the radiation dose to as low as reasonably achievable. Stenosis of the internal carotid arteries measured using NASCET criteria. COMPARISON: None available CLINICAL HISTORY: Neuro deficit, acute, stroke suspected FINDINGS: AORTIC ARCH AND ARCH VESSELS: No dissection or arterial injury. No significant stenosis of the brachiocephalic or subclavian arteries. CERVICAL CAROTID ARTERIES: No dissection, arterial injury, or hemodynamically significant stenosis by NASCET criteria. CERVICAL VERTEBRAL ARTERIES: No dissection, arterial injury, or significant stenosis. LUNGS AND MEDIASTINUM: Unremarkable. SOFT TISSUES: No acute abnormality. BONES: No acute abnormality. ANTERIOR CIRCULATION: No significant stenosis of the internal carotid arteries. Severe left clA3 ACA stenosis. No significant stenosis of the middle cerebral arteries. No aneurysm. POSTERIOR CIRCULATION: Severe distal left P2 PCA stenosis. No significant stenosis of the basilar artery. No significant stenosis of the vertebral arteries. No aneurysm. OTHER: No dural venous sinus thrombosis on this non-dedicated study. IMPRESSION: 1. No large vessel occlusion. 2. Severe left A3 ACA stenosis. 3. Severe distal left P2 PCA stenosis. Electronically signed by: Gilmore Molt MD 12/10/2023 02:05 AM EST RP Workstation: [id number]   CT HEAD WO CONTRAST Result Date: 12/09/2023 EXAM: CT HEAD WITHOUT CONTRAST 12/09/2023 03:20:49 PM TECHNIQUE: CT of the head was performed without the administration of intravenous contrast. Automated exposure control, iterative reconstruction, and/or weight based adjustment of the mA/kV was utilized to reduce the radiation dose to as low as reasonably achievable. COMPARISON: None available. CLINICAL HISTORY: Neuro deficit, acute, stroke suspected. FINDINGS: BRAIN AND VENTRICLES: There is no evidence of an acute infarct, intracranial hemorrhage, mass,  midline shift, hydrocephalus, or extra-axial fluid collection. There is mild cerebral atrophy. 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 Vertebrobasilar dolicoectasia. ORBITS: No acute abnormality. SINUSES: No acute abnormality. SOFT TISSUES AND SKULL: No acute soft tissue abnormality. No skull fracture. IMPRESSION: 1. No acute intracranial abnormality. 2. Extensive chronic small vessel ischemic disease. Electronically signed by: Dasie Hamburg MD 12/09/2023 03:53 PM EST RP Workstation: [id number]    Microbiology: Results for orders placed or performed in visit on 03/11/19  Novel Coronavirus, NAA ([facility])     Status: Abnormal   Collection Time: 03/11/19 12:13 PM   Specimen: Nasopharyngeal(NP) swabs in vial transport medium   NASOPHARYNGE  TESTING  Result Value Ref Range Status   SARS-CoV-2, NAA Detected (A) Not Detected Final    Comment: This nucleic acid amplification test was developed and its performance characteristics determined by [employer]. Nucleic acid amplification tests include RT-PCR and TMA. This test has not been FDA cleared or approved. This test has been authorized by FDA under an Emergency Use Authorization (EUA). This test is only authorized for the duration of time the declaration that circumstances exist justifying the authorization of the emergency use of in vitro diagnostic tests for detection of SARS-CoV-2 virus and/or diagnosis of COVID-19 infection under section 564(b)(1) of the Act, 21 U.S.C. 639aaa-6(a) (1), unless the authorization is terminated or revoked sooner. When diagnostic testing is negative, the possibility of a false negative result should be considered in the context of a patient's recent exposures and the presence of clinical  signs and  symptoms consistent with COVID-19. An individual without symptoms of COVID-19 and who is not shedding SARS-CoV-2 virus wo uld expect to have a negative (not detected) result in this assay.     Labs: CBC: Recent Labs  Lab 12/09/23 1454 12/09/23 2339 12/10/23 0201  WBC 11.1* 8.9 7.8  NEUTROABS 8.4*  --  5.1  HGB 17.2* 15.8 15.0  HCT 49.7 46.4 43.9  MCV 87.0 87.1 86.9  PLT 220 203 174   Basic Metabolic Panel: Recent Labs  Lab 12/09/23 1454 12/09/23 2339 12/10/23 0201  NA 137  --  137  K 3.7  --  3.4*  CL 98  --  103  CO2 26  --  24  GLUCOSE 103*  --  132*  BUN 16  --  16  CREATININE 1.03 1.12 1.05  CALCIUM 10.1  --  8.8*  MG  --   --  2.1  PHOS  --   --  2.9   Liver Function Tests: Recent Labs  Lab 12/09/23 1454  AST 26  ALT 33  ALKPHOS 87  BILITOT 0.7  PROT 8.0  ALBUMIN 5.0   CBG: Recent Labs  Lab 12/09/23 1506  GLUCAP 91    Discharge time spent: greater than 30 minutes.  Signed: Sabas GORMAN Brod, MD [facility] 12/10/2023

## 2023-12-10 NOTE — Evaluation (Signed)
 Speech Language Pathology Evaluation Patient Details Name: Randy Sutton MRN: [id number] DOB: 14-Jan-1951 Today's Date: 12/10/2023 Time: 9169-9146 SLP Time Calculation (min) (ACUTE ONLY): 23 min  Problem List:  Patient Active Problem List   Diagnosis Date Noted   TIA (transient ischemic attack) 12/09/2023   Chronic right shoulder pain 06/12/2016   Poison ivy dermatitis 05/12/2013   Acute sinus infection 10/29/2012   Allergic rhinitis 10/09/2011   Umbilical hernia    Erectile dysfunction    Preventative health care 04/09/2011   History of colonic polyps 08/08/2009   Past Medical History:  Past Medical History:  Diagnosis Date   COLONIC POLYPS, HX OF 08/08/2009   Erectile dysfunction    Umbilical hernia    Past Surgical History:  Past Surgical History:  Procedure Laterality Date   HERNIA REPAIR     RIH   HPI:  Pt is a 73 y/o M who presented to [facility] after ~1 hour episode of RHB numbness/weakness, dizziness, and speech difficulty. MRI revealed small acute or subacute infarct in L posterior frontal white matter. CTA showed severe L ACA and PCA stenosis. No significant PMHx documented; of note, pt has not seen PCP in 3+ years.   Assessment / Plan / Recommendation Clinical Impression  Patient was evaluated via the Cognistat to assess cognitive linguistic functioning. 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 Patient with functional expressive/receptive language, long term memory, orientation, and problem solving. Observed mild-moderate deficits in short term memory which was likely impacted by deficits in sustained attention. Patient would benefit from short term SLP services to target education and memory strategies during inpatient stay.     SLP Assessment  SLP Recommendation/Assessment: Patient needs continued Speech Language Pathology Services SLP Visit Diagnosis: Cognitive communication deficit (R41.841)     Assistance Recommended at Discharge   Intermittent Supervision/Assistance  Functional Status Assessment Patient has had a recent decline in their functional status and demonstrates the ability to make significant improvements in function in a reasonable and predictable amount of time.  Frequency and Duration min 1 x/week  2 weeks      SLP Evaluation Cognition  Overall Cognitive Status: Impaired/Different from baseline Arousal/Alertness: Awake/alert Orientation Level: Oriented X4 Year: 2025 Month: November Day of Week: Correct Attention: Focused;Sustained Focused Attention: Appears intact Sustained Attention: Impaired Sustained Attention Impairment: Verbal basic Memory: Impaired Memory Impairment: Decreased short term memory Decreased Short Term Memory: Verbal basic Awareness: Impaired Awareness Impairment: Intellectual impairment Problem Solving: Appears intact       Comprehension  Auditory Comprehension Overall Auditory Comprehension: Appears within functional limits for tasks assessed Yes/No Questions: Within Functional Limits Commands: Within Functional Limits Conversation: Complex Interfering Components: Attention    Expression Expression Primary Mode of Expression: Verbal Verbal Expression Overall Verbal Expression: Appears within functional limits for tasks assessed Initiation: No impairment Repetition: No impairment Naming: No impairment Interfering Components: Attention   Oral / Motor  Motor Speech Overall Motor Speech: Appears within functional limits for tasks assessed           Presli Fanguy M.A., CCC-SLP 12/10/2023, 9:00 AM

## 2023-12-29 ENCOUNTER — Other Ambulatory Visit (HOSPITAL_COMMUNITY): Payer: Self-pay

## 2024-01-09 ENCOUNTER — Telehealth: Payer: Self-pay

## 2024-01-09 NOTE — Telephone Encounter (Signed)
 Spoke to patient and verified that he has not had a previous sleep study.

## 2024-01-11 NOTE — Progress Notes (Unsigned)
 Synopsis: Referred in 12/2023 for evaluation of snoring by Epimenio Patee, DO  Subjective:   PATIENT ID: Randy Sutton GENDER: male DOB: 06/12/1950, MRN: [id number]  Chief Complaint  Patient presents with   Consult    Sleep- referred by Patee Epimenio, DO.  Pt has snoring and wakes up with dry, irritated throat. Epworth= 2.     HPI  Randy Sutton is a 73 y.o. male with recent ischemic stroke (L posterior frontal, dx 12/2023) who presents today for evaluation of possible sleep apnea.  Review of 12/10/2023 discharge summary shows the patient had an acute ischemic stroke to the L posterior frontal white matter. Echo was unrevealing and A1c 5.0%. 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 Suspected etiology: small vessel disease (per review of the 12/10/2023 Neurology note).  Sleep habits: - Snoring: Yes, loud enough to be heard from another room - Sleep latency: 5-10 mins - Number of awakenings per night: 2-3x/night to urinate - Waking headaches: No - Sleep aids: None - Stimulant use: None (no caffeine use either) - H/o MVAs / drowsy driving: Denies.  Comorbidities: - Hypertension: Yes - Diabetes: No - Obesity: No  Other: - GLP-1 use: No  Pulm Questionnaires:     01/12/2024   10:00 AM  Results of the Epworth flowsheet  Sitting and reading 0  Watching TV 1  Sitting, inactive in a public place (e.g. a theatre or a meeting) 0  As a passenger in a car for an hour without a break 1  Lying down to rest in the afternoon when circumstances permit 0  Sitting and talking to someone 0  Sitting quietly after a lunch without alcohol 0  In a car, while stopped for a few minutes in traffic 0  Total score 2     Past Medical History:  Diagnosis Date   COLONIC POLYPS, HX OF 08/08/2009   Erectile dysfunction    Umbilical hernia      Family History  Problem  Relation Age of Onset   Hypertension Mother    Cancer Father      Past Surgical History:  Procedure Laterality Date   HERNIA REPAIR     RIH    Social History   Socioeconomic History   Marital status: Widowed    Spouse name: Not on file   Number of children: Not on file   Years of education: Not on file   Highest education level: Not on file  Occupational History   Not on file  Tobacco Use   Smoking status: Former    Current packs/day: 1.00    Average packs/day: 1 pack/day for 32.0 years (32.0 ttl pk-yrs)    Types: Cigarettes   Smokeless tobacco: Never   Tobacco comments:    Quit smoking 2005  Vaping Use   Vaping status: Never Used  Substance and Sexual Activity   Alcohol use: Yes    Comment: sicial   Drug use: No   Sexual activity: Not on file  Other Topics Concern   Not on file  Social History Narrative   Not on file   Social Drivers of Health   Financial Resource Strain: Not on file  Food Insecurity: No Food Insecurity (12/09/2023)   Hunger Vital Sign    Worried About Running Out of Food in the Last Year: Never true    Ran Out of  Food in the Last Year: Never true  Transportation Needs: No Transportation Needs (12/09/2023)   PRAPARE - Administrator, Civil Service (Medical): No    Lack of Transportation (Non-Medical): No  Physical Activity: Not on file  Stress: Not on file  Social Connections: Socially Isolated (12/09/2023)   Social Connection and Isolation Panel    Frequency of Communication with Friends and Family: Twice a week    Frequency of Social Gatherings with Friends and Family: Twice a week    Attends Religious Services: Never    Database Administrator or Organizations: No    Attends Banker Meetings: Never    Marital Status: Widowed  Intimate Partner Violence: Not At Risk (12/09/2023)   Humiliation, Afraid, Rape, and Kick questionnaire    Fear of Current or Ex-Partner: No    Emotionally Abused: No    Physically Abused:  No    Sexually Abused: No     No Known Allergies   Outpatient Medications Prior to Visit  Medication Sig Dispense Refill   amLODipine  (NORVASC ) 5 MG tablet Take 1 tablet (5 mg total) by mouth daily. 30 tablet 1   aspirin  EC 81 MG tablet Take 1 tablet (81 mg total) by mouth daily. Swallow whole. Take aspirin  and Plavix  together for 3 weeks, then stop Plavix  and continue taking aspirin  30 tablet 12   atorvastatin  (LIPITOR) 80 MG tablet Take 1 tablet (80 mg total) by mouth daily. 30 tablet 1   cetirizine (ZYRTEC) 10 MG tablet Take 10 mg by mouth daily.     Multiple Vitamin (MULTIVITAMIN) tablet Take 1 tablet by mouth daily.     No facility-administered medications prior to visit.    ROS   Objective:  Physical Exam Vitals reviewed.  Constitutional:      Appearance: Normal appearance. He is normal weight.  HENT:     Head: Normocephalic and atraumatic.     Mouth/Throat:     Mouth: Mucous membranes are moist.     Pharynx: Oropharynx is clear. No oropharyngeal exudate or posterior oropharyngeal erythema.     Comments: Mallampati II, no tongue scalloping. Eyes:     General: No scleral icterus.       Right eye: No discharge.        Left eye: No discharge.     Conjunctiva/sclera: Conjunctivae normal.  Pulmonary:     Effort: Pulmonary effort is normal.  Neurological:     Mental Status: He is alert and oriented to person, place, and time. Mental status is at baseline.  Psychiatric:        Behavior: Behavior normal.        Thought Content: Thought content normal.        Judgment: Judgment normal.      Vitals:   01/12/24 1020  BP: 138/80  Pulse: 80  SpO2: 97%  Weight: 211 lb 3.2 oz (95.8 kg)  Height: 6' 1 (1.854 m)   97% on RA BMI Readings from Last 3 Encounters:  01/12/24 27.86 kg/m  12/09/23 26.06 kg/m  06/13/17 25.89 kg/m   Wt Readings from Last 3 Encounters:  01/12/24 211 lb 3.2 oz (95.8 kg)  12/09/23 197 lb 8.5 oz (89.6 kg)  06/13/17 196 lb 3.2 oz (89 kg)      CBC    Component Value Date/Time   WBC 7.8 12/10/2023 0201   RBC 5.05 12/10/2023 0201   HGB 15.0 12/10/2023 0201   HCT 43.9 12/10/2023 0201   PLT 174  12/10/2023 0201   MCV 86.9 12/10/2023 0201   MCH 29.7 12/10/2023 0201   MCHC 34.2 12/10/2023 0201   RDW 12.7 12/10/2023 0201   LYMPHSABS 1.7 12/10/2023 0201   MONOABS 0.8 12/10/2023 0201   EOSABS 0.1 12/10/2023 0201   BASOSABS 0.1 12/10/2023 0201   Hemoglobin A1c: 5.0% TSH 1.33  Chest Imaging: None  Other Imaging: 12/10/2023 CTA Head and Neck (w/wo contrast): 1. No large vessel occlusion. 2. Severe left A3 ACA stenosis. 3. Severe distal left P2 PCA stenosis.  12/10/2023 MRI Brain w/o contrast 1. Small acute or subacute infarct in the left posterior frontal white matter.    Pulmonary Functions Testing Results:     No data to display          FeNO: None  Pathology: N/A  Echocardiogram: 12/10/2023: 1. Left ventricular ejection fraction, by estimation, is 55 to 60%. The  left ventricle has normal function. The left ventricle has no regional  wall motion abnormalities. There is mild concentric left ventricular  hypertrophy. Left ventricular diastolic  parameters are consistent with Grade I diastolic dysfunction (impaired  relaxation).   2. Right ventricular systolic function is normal. The right ventricular  size is normal.   3. The mitral valve is normal in structure. No evidence of mitral valve  regurgitation. No evidence of mitral stenosis.   4. The aortic valve is tricuspid. Aortic valve regurgitation is not  visualized. No aortic stenosis is present.   Heart Catheterization: N/A  Sleep Studies: None    Assessment & Plan:     ICD-10-CM   1. Snoring  R06.83 Split night study    2. History of recent stroke  Z86.73     3. Primary hypertension  I10       Discussion:  - This patient's recent stroke significantly increases his risk of CSA and/or TE-CSA. Although his loud snoring predates his  stroke, suggesting he had OSA prior to this event, OSA can evolve into mixed obstructive and central sleep apnea following a stroke.  - The best test for this patient is therefore a Split Night PSG (in-lab study) which I have ordered for him today.  - His hypertension is adequately controlled today on amlodipine  5 mg daily which he confirmed he has been taking.   Current Outpatient Medications:    amLODipine  (NORVASC ) 5 MG tablet, Take 1 tablet (5 mg total) by mouth daily., Disp: 30 tablet, Rfl: 1   aspirin  EC 81 MG tablet, Take 1 tablet (81 mg total) by mouth daily. Swallow whole. Take aspirin  and Plavix  together for 3 weeks, then stop Plavix  and continue taking aspirin , Disp: 30 tablet, Rfl: 12   atorvastatin  (LIPITOR) 80 MG tablet, Take 1 tablet (80 mg total) by mouth daily., Disp: 30 tablet, Rfl: 1   cetirizine (ZYRTEC) 10 MG tablet, Take 10 mg by mouth daily., Disp: , Rfl:    Multiple Vitamin (MULTIVITAMIN) tablet, Take 1 tablet by mouth daily., Disp: , Rfl:    MDM Level: Moderate   Lamar Dales, MD Pulmonary, Critical Care & Sleep Medicine [facility] 01/12/24 11:02 AM

## 2024-01-12 ENCOUNTER — Ambulatory Visit (INDEPENDENT_AMBULATORY_CARE_PROVIDER_SITE_OTHER): Admitting: Pulmonary Disease

## 2024-01-12 ENCOUNTER — Encounter: Payer: Self-pay | Admitting: Pulmonary Disease

## 2024-01-12 VITALS — BP 138/80 | HR 80 | Ht 73.0 in | Wt 211.2 lb

## 2024-01-12 DIAGNOSIS — I10 Essential (primary) hypertension: Secondary | ICD-10-CM

## 2024-01-12 DIAGNOSIS — R0683 Snoring: Secondary | ICD-10-CM

## 2024-01-12 DIAGNOSIS — Z8673 Personal history of transient ischemic attack (TIA), and cerebral infarction without residual deficits: Secondary | ICD-10-CM

## 2024-01-12 NOTE — Patient Instructions (Signed)
  You will receive a call to schedule your Sleep Study.  ----------   There is no need to schedule a follow up visit today. Our office will call you with the results and next steps, and you will schedule your next visit at that time.  ----------

## 2024-03-02 ENCOUNTER — Ambulatory Visit (HOSPITAL_BASED_OUTPATIENT_CLINIC_OR_DEPARTMENT_OTHER): Admitting: Pulmonary Disease

## 2024-03-02 ENCOUNTER — Encounter (HOSPITAL_BASED_OUTPATIENT_CLINIC_OR_DEPARTMENT_OTHER): Payer: Self-pay
